# Patient Record
Sex: Female | Born: 1990
Health system: Southern US, Community
[De-identification: ages and names within clinical notes are randomized; demographics above are authoritative.]

## PROBLEM LIST (undated history)

## (undated) DIAGNOSIS — D649 Anemia, unspecified: Secondary | ICD-10-CM

## (undated) DIAGNOSIS — K921 Melena: Secondary | ICD-10-CM

## (undated) DIAGNOSIS — K219 Gastro-esophageal reflux disease without esophagitis: Secondary | ICD-10-CM

## (undated) DIAGNOSIS — D219 Benign neoplasm of connective and other soft tissue, unspecified: Secondary | ICD-10-CM

## (undated) DIAGNOSIS — N390 Urinary tract infection, site not specified: Secondary | ICD-10-CM

## (undated) DIAGNOSIS — A64 Unspecified sexually transmitted disease: Secondary | ICD-10-CM

## (undated) DIAGNOSIS — B019 Varicella without complication: Secondary | ICD-10-CM

## (undated) HISTORY — PX: OTHER SURGICAL HISTORY: SHX169

## (undated) HISTORY — PX: COLPOSCOPY: SHX161

## (undated) HISTORY — DX: Melena: K92.1

## (undated) HISTORY — DX: Benign neoplasm of connective and other soft tissue, unspecified: D21.9

## (undated) HISTORY — DX: Unspecified sexually transmitted disease: A64

## (undated) HISTORY — DX: Gastro-esophageal reflux disease without esophagitis: K21.9

## (undated) HISTORY — DX: Urinary tract infection, site not specified: N39.0

## (undated) HISTORY — DX: Anemia, unspecified: D64.9

## (undated) HISTORY — DX: Varicella without complication: B01.9

---

## 2013-08-18 ENCOUNTER — Inpatient Hospital Stay: Payer: Self-pay | Admitting: Internal Medicine

## 2013-08-18 LAB — COMPREHENSIVE METABOLIC PANEL
AST: 29 U/L (ref 15–37)
Albumin: 3.9 g/dL (ref 3.4–5.0)
Alkaline Phosphatase: 91 U/L
Anion Gap: 8 (ref 7–16)
BUN: 9 mg/dL (ref 7–18)
Bilirubin,Total: 0.7 mg/dL (ref 0.2–1.0)
CHLORIDE: 104 mmol/L (ref 98–107)
Calcium, Total: 9.4 mg/dL (ref 8.5–10.1)
Co2: 24 mmol/L (ref 21–32)
Creatinine: 0.82 mg/dL (ref 0.60–1.30)
EGFR (Non-African Amer.): >60
Glucose: 110 mg/dL — ABNORMAL HIGH (ref 65–99)
Osmolality: 271 (ref 275–301)
Potassium: 3.6 mmol/L (ref 3.5–5.1)
SGPT (ALT): 34 U/L (ref 12–78)
Sodium: 136 mmol/L (ref 136–145)
TOTAL PROTEIN: 8.1 g/dL (ref 6.4–8.2)

## 2013-08-18 LAB — CBC WITH DIFFERENTIAL/PLATELET
Basophil #: 0.1 10*3/uL (ref 0.0–0.1)
Basophil %: 0.6 %
EOS ABS: 0 10*3/uL (ref 0.0–0.7)
EOS PCT: 0 %
HCT: 38.9 % (ref 35.0–47.0)
HGB: 13 g/dL (ref 12.0–16.0)
LYMPHS PCT: 15.8 %
Lymphocyte #: 2.3 10*3/uL (ref 1.0–3.6)
MCH: 30.8 pg (ref 26.0–34.0)
MCHC: 33.5 g/dL (ref 32.0–36.0)
MCV: 92 fL (ref 80–100)
MONO ABS: 0.8 x10 3/mm (ref 0.2–0.9)
Monocyte %: 5.3 %
NEUTROS ABS: 11.2 10*3/uL — AB (ref 1.4–6.5)
NEUTROS PCT: 78.3 %
Platelet: 325 10*3/uL (ref 150–440)
RBC: 4.23 10*6/uL (ref 3.80–5.20)
RDW: 12.9 % (ref 11.5–14.5)
WBC: 14.4 10*3/uL — ABNORMAL HIGH (ref 3.6–11.0)

## 2013-08-18 LAB — URINALYSIS, COMPLETE
Bacteria: NONE SEEN
Bilirubin,UR: NEGATIVE
GLUCOSE, UR: NEGATIVE mg/dL (ref 0–75)
Nitrite: NEGATIVE
Ph: 7 (ref 4.5–8.0)
Protein: NEGATIVE
RBC,UR: 6 /HPF (ref 0–5)
SPECIFIC GRAVITY: 1.016 (ref 1.003–1.030)
Squamous Epithelial: 4
WBC UR: 5 /HPF (ref 0–5)

## 2013-08-18 LAB — LIPASE, BLOOD: Lipase: 141 U/L (ref 73–393)

## 2013-08-19 LAB — CBC WITH DIFFERENTIAL/PLATELET
BASOS ABS: 0 10*3/uL (ref 0.0–0.1)
Basophil %: 0.2 %
EOS ABS: 0 10*3/uL (ref 0.0–0.7)
Eosinophil %: 0.3 %
HCT: 34.9 % — ABNORMAL LOW (ref 35.0–47.0)
HGB: 11.9 g/dL — ABNORMAL LOW (ref 12.0–16.0)
Lymphocyte #: 2.5 10*3/uL (ref 1.0–3.6)
Lymphocyte %: 20.7 %
MCH: 31.8 pg (ref 26.0–34.0)
MCHC: 34.1 g/dL (ref 32.0–36.0)
MCV: 93 fL (ref 80–100)
Monocyte #: 0.9 x10 3/mm (ref 0.2–0.9)
Monocyte %: 7.2 %
NEUTROS ABS: 8.8 10*3/uL — AB (ref 1.4–6.5)
Neutrophil %: 71.6 %
Platelet: 266 10*3/uL (ref 150–440)
RBC: 3.75 10*6/uL — AB (ref 3.80–5.20)
RDW: 13 % (ref 11.5–14.5)
WBC: 12.3 10*3/uL — AB (ref 3.6–11.0)

## 2013-08-19 LAB — COMPREHENSIVE METABOLIC PANEL
ALK PHOS: 77 U/L
ANION GAP: 4 — AB (ref 7–16)
AST: 20 U/L (ref 15–37)
Albumin: 3.2 g/dL — ABNORMAL LOW (ref 3.4–5.0)
BILIRUBIN TOTAL: 0.8 mg/dL (ref 0.2–1.0)
BUN: 9 mg/dL (ref 7–18)
CHLORIDE: 108 mmol/L — AB (ref 98–107)
Calcium, Total: 8.6 mg/dL (ref 8.5–10.1)
Co2: 26 mmol/L (ref 21–32)
Creatinine: 0.89 mg/dL (ref 0.60–1.30)
EGFR (African American): >60
EGFR (Non-African Amer.): >60
GLUCOSE: 85 mg/dL (ref 65–99)
OSMOLALITY: 274 (ref 275–301)
Potassium: 3.6 mmol/L (ref 3.5–5.1)
SGPT (ALT): 26 U/L (ref 12–78)
SODIUM: 138 mmol/L (ref 136–145)
Total Protein: 6.8 g/dL (ref 6.4–8.2)

## 2013-08-19 LAB — MAGNESIUM: MAGNESIUM: 1.7 mg/dL — AB

## 2013-08-20 LAB — CBC WITH DIFFERENTIAL/PLATELET
BASOS ABS: 0.1 10*3/uL (ref 0.0–0.1)
Basophil %: 0.4 %
EOS PCT: 0.2 %
Eosinophil #: 0 10*3/uL (ref 0.0–0.7)
HCT: 34.6 % — AB (ref 35.0–47.0)
HGB: 11.7 g/dL — AB (ref 12.0–16.0)
Lymphocyte #: 3.1 10*3/uL (ref 1.0–3.6)
Lymphocyte %: 26 %
MCH: 31.4 pg (ref 26.0–34.0)
MCHC: 33.9 g/dL (ref 32.0–36.0)
MCV: 93 fL (ref 80–100)
MONOS PCT: 7.4 %
Monocyte #: 0.9 x10 3/mm (ref 0.2–0.9)
Neutrophil #: 7.9 10*3/uL — ABNORMAL HIGH (ref 1.4–6.5)
Neutrophil %: 66 %
Platelet: 263 10*3/uL (ref 150–440)
RBC: 3.74 10*6/uL — ABNORMAL LOW (ref 3.80–5.20)
RDW: 12.8 % (ref 11.5–14.5)
WBC: 12 10*3/uL — ABNORMAL HIGH (ref 3.6–11.0)

## 2013-08-20 LAB — COMPREHENSIVE METABOLIC PANEL
ANION GAP: 3 — AB (ref 7–16)
Albumin: 3 g/dL — ABNORMAL LOW (ref 3.4–5.0)
Alkaline Phosphatase: 79 U/L
BILIRUBIN TOTAL: 1 mg/dL (ref 0.2–1.0)
BUN: 6 mg/dL — AB (ref 7–18)
CHLORIDE: 107 mmol/L (ref 98–107)
Calcium, Total: 8.6 mg/dL (ref 8.5–10.1)
Co2: 28 mmol/L (ref 21–32)
Creatinine: 0.95 mg/dL (ref 0.60–1.30)
EGFR (African American): >60
Glucose: 92 mg/dL (ref 65–99)
Osmolality: 273 (ref 275–301)
POTASSIUM: 3.7 mmol/L (ref 3.5–5.1)
SGOT(AST): 19 U/L (ref 15–37)
SGPT (ALT): 24 U/L (ref 12–78)
SODIUM: 138 mmol/L (ref 136–145)
Total Protein: 7.1 g/dL (ref 6.4–8.2)

## 2013-08-20 LAB — LIPASE, BLOOD: Lipase: 122 U/L (ref 73–393)

## 2013-08-20 LAB — URINE CULTURE

## 2013-08-21 LAB — COMPREHENSIVE METABOLIC PANEL
ALBUMIN: 2.8 g/dL — AB (ref 3.4–5.0)
ALT: 24 U/L (ref 12–78)
Alkaline Phosphatase: 71 U/L
Anion Gap: 5 — ABNORMAL LOW (ref 7–16)
BUN: 4 mg/dL — ABNORMAL LOW (ref 7–18)
Bilirubin,Total: 0.7 mg/dL (ref 0.2–1.0)
Calcium, Total: 8.5 mg/dL (ref 8.5–10.1)
Chloride: 109 mmol/L — ABNORMAL HIGH (ref 98–107)
Co2: 27 mmol/L (ref 21–32)
Creatinine: 0.93 mg/dL (ref 0.60–1.30)
EGFR (African American): >60
EGFR (Non-African Amer.): >60
GLUCOSE: 88 mg/dL (ref 65–99)
OSMOLALITY: 278 (ref 275–301)
POTASSIUM: 3.6 mmol/L (ref 3.5–5.1)
SGOT(AST): 18 U/L (ref 15–37)
SODIUM: 141 mmol/L (ref 136–145)
Total Protein: 6.5 g/dL (ref 6.4–8.2)

## 2013-08-21 LAB — CBC WITH DIFFERENTIAL/PLATELET
Basophil #: 0.1 10*3/uL (ref 0.0–0.1)
Basophil %: 0.6 %
EOS PCT: 1.1 %
Eosinophil #: 0.1 10*3/uL (ref 0.0–0.7)
HCT: 32.8 % — AB (ref 35.0–47.0)
HGB: 11 g/dL — AB (ref 12.0–16.0)
LYMPHS ABS: 3.6 10*3/uL (ref 1.0–3.6)
Lymphocyte %: 40.7 %
MCH: 31.2 pg (ref 26.0–34.0)
MCHC: 33.5 g/dL (ref 32.0–36.0)
MCV: 93 fL (ref 80–100)
Monocyte #: 0.6 x10 3/mm (ref 0.2–0.9)
Monocyte %: 6.8 %
NEUTROS PCT: 50.8 %
Neutrophil #: 4.5 10*3/uL (ref 1.4–6.5)
Platelet: 253 10*3/uL (ref 150–440)
RBC: 3.52 10*6/uL — ABNORMAL LOW (ref 3.80–5.20)
RDW: 12.9 % (ref 11.5–14.5)
WBC: 8.9 10*3/uL (ref 3.6–11.0)

## 2013-08-22 LAB — COMPREHENSIVE METABOLIC PANEL
ALK PHOS: 81 U/L
Albumin: 3 g/dL — ABNORMAL LOW (ref 3.4–5.0)
Anion Gap: 10 (ref 7–16)
BILIRUBIN TOTAL: 1.2 mg/dL — AB (ref 0.2–1.0)
BUN: 5 mg/dL — AB (ref 7–18)
Calcium, Total: 9 mg/dL (ref 8.5–10.1)
Chloride: 100 mmol/L (ref 98–107)
Co2: 27 mmol/L (ref 21–32)
Creatinine: 0.93 mg/dL (ref 0.60–1.30)
EGFR (Non-African Amer.): >60
Glucose: 112 mg/dL — ABNORMAL HIGH (ref 65–99)
Osmolality: 272 (ref 275–301)
POTASSIUM: 3.4 mmol/L — AB (ref 3.5–5.1)
SGOT(AST): 41 U/L — ABNORMAL HIGH (ref 15–37)
SGPT (ALT): 42 U/L (ref 12–78)
SODIUM: 137 mmol/L (ref 136–145)
TOTAL PROTEIN: 7.3 g/dL (ref 6.4–8.2)

## 2013-08-22 LAB — CBC WITH DIFFERENTIAL/PLATELET
Basophil #: 0 10*3/uL
Basophil %: 0.1 %
Eosinophil #: 0 10*3/uL
Eosinophil %: 0 %
HCT: 35.8 %
HGB: 12.5 g/dL
Lymphocyte %: 8.3 %
Lymphs Abs: 1.3 10*3/uL
MCH: 31.8 pg
MCHC: 34.8 g/dL
MCV: 91 fL
Monocyte #: 0.9 10*3/uL
Monocyte %: 6.1 %
Neutrophil #: 13.2 10*3/uL — ABNORMAL HIGH
Neutrophil %: 85.5 %
Platelet: 302 10*3/uL
RBC: 3.92 X10 6/mm 3
RDW: 12.8 %
WBC: 15.4 10*3/uL — ABNORMAL HIGH

## 2013-08-22 LAB — PATHOLOGY REPORT

## 2014-08-22 NOTE — Op Note (Signed)
PATIENT NAME:  Linda Ochoa, Linda Ochoa MR#:  [id number] DATE OF BIRTH:  [birth year]  DATE OF PROCEDURE:  08/21/2013  PREOPERATIVE DIAGNOSIS: Choledocholithiasis.  POSTOPERATIVE DIAGNOSIS:  Choledocholithiasis with cholecystitis.  PROCEDURE:  Laparoscopic cholecystectomy with cholangiography.   SURGEON: Phoebe Perch, MD.   ANESTHESIA: General with endotracheal tube.   INDICATIONS FOR PROCEDURE: This is a patient with a history of choledocholithiasis status post ERCP and stone extraction by Dr. Allen Norris. Preoperatively, we discussed rationale for offering surgery and the risks of bleeding, infection, recurrence of symptoms, failure to resolve all her symptoms, open procedure, bile duct damage, bile duct leak, retained common bile duct stone, any of which could require further surgery and/or ERCP, stent and papillotomy. This was all reviewed for her and her family. They understood and agreed to proceed.   FINDINGS: Acute cholecystitis with a multitude of stones, a long tortuous cystic duct. C-arm fluoroscopic cholangiography confirmed that the cystic duct had been entered. Proximal and distal bile ducts were identified. There was good flow in the duodenum without intraluminal filling defects.  Also of note, there was unusual anatomy due to greatly distended and enlarged gallbladder with the infundibulum flopping over the typical cystic duct area resulting in unusual anatomy, but confirmation of proper anatomy was performed by cholangiography prior to clipping or dividing ducts.   DESCRIPTION OF PROCEDURE: The patient was induced to general anesthesia. She was on IV antibiotics and VTE prophylaxis was in place. She was prepped and draped in a sterile fashion. Marcaine was infiltrated in skin and subcutaneous tissues around the periumbilical area. Incision was made. Veress needle was placed. Pneumoperitoneum was obtained. A 5 mm trocar port was placed. The abdominal cavity was explored, and under direct vision a  10 mm epigastric port and 2 lateral 5 mm ports were placed. The gallbladder was placed on tension. It was found to be intrahepatic and greatly distended. The infundibulum extended well down towards the pancreas. Elevation of the infundibulum after placing the gallbladder on tension allowed for visualization of what appeared to be a lateral filling structure that collapsed with manual pressure and refilled promptly, much like a venous structure. It was quite large and was lateral to an area of vasculature. Dissection out of the small vessels, including veins and arteries, was performed by doubly clipping and dividing these. Further meticulous dissection was performed in order to ultimately identify that the cystic duct was, in fact, this lateral structure. It was elevated and clipped and incised and through a separate incision, an Angiocath and cholangiogram catheter was placed. C-arm fluoroscopic cholangiography demonstrated the above. Therefore, the cystic duct was triply clipped and divided and then further dissection was performed to allow elevation of the gallbladder. The gallbladder was greatly inflamed and edematous, and because of its intrahepatic component, the gallbladder tore and spilled a multitude of stones. Once the gallbladder was removed, it was realized that a portion of the back wall of the gallbladder had been left in situ. The gallbladder was placed into a bag with a large number of stones and retrieved through the enlarged epigastric port site. A second bag was placed to retrieve the stones, and all stones that were visible were removed and sent off for examination with the gallbladder. The area was irrigated with copious amounts of normal saline. There was no sign of bile leak, bleeding or additional stones or bowel injury. Therefore, into the lateral port site was placed a 10 mm JP drain placed into the foramen of Winslow and held in with  3-0 nylon. The area was checked for hemostasis and found  to be adequate. The camera was placed in the epigastric site to view back the periumbilical site. There was no sign of stone spillage or bowel injury. Therefore, pneumoperitoneum was released. All ports were removed. Fascial edges were approximated with figure-of-eight 0 Vicryls and then staples were placed and sterile dressing was placed. The drain was placed to bulb suction.  The patient tolerated this procedure well. There were no complications. She was taken to the recovery room in stable condition to be admitted for continued care.   ____________________________ Jerrol Banana. Burt Knack, MD rec:ce D: 08/21/2013 13:42:22 ET T: 08/21/2013 14:11:34 ET JOB#: [id number]  cc: Jerrol Banana. Burt Knack, MD, <Dictator> Florene Glen MD ELECTRONICALLY SIGNED 08/21/2013 18:22

## 2014-08-22 NOTE — Discharge Summary (Signed)
PATIENT NAME:  Linda Ochoa, Linda Ochoa MR#:  [id number] DATE OF BIRTH:  12-22-1990  DATE OF ADMISSION:  08/18/2013 DATE OF DISCHARGE:  08/23/2013  ADMISSION DIAGNOSIS: Abdominal pain.   DISCHARGE DIAGNOSIS: Choledocholithiasis with cholecystitis.   CONSULTATIONS:  1.  Dr. Allen Norris.  2.  Dr. Burt Knack.   PROCEDURES:  1.  The patient underwent ERCP, 08/19/2013, which showed choledocholithiasis.  2.  On 08/21/2013, the patient underwent a laparoscopic cholecystectomy.   LABORORIES AT DISCHARGE: White blood cells 15, hemoglobin 12.5, hematocrit 36, platelets 382. Sodium 137, potassium 3.4, chloride 100, bicarb 27, BUN 5, creatinine 0.93,  glucose is 112, AST is 42, ALT 41, total protein 7.3, albumin 3.0.   HOSPITAL COURSE: This is a 24 year old female with no past medical history, presented with abdominal pain, nausea and vomiting, was found to have choledocholithiasis with acute cholecystitis. For further details, please refer to the H and P.   1.  Abdominal pain. The patient presented with abdominal pain. She was found to have choledocholithiasis with acute cholecystitis. She was placed on broad-spectrum antibiotics. GI and surgery were consulted. She underwent an ERCP, as stated above, on the 21st of April, and then underwent a laparoscopic cholecystectomy with cholangiogram on the 23rd of April. She has some mild abdominal pain from her surgery, but is tolerating her diet fairly well. She will follow-up Dr. Burt Knack in two weeks.  2.  Choledocholithiasis due to cholecystitis;  resolving   3.  Hypomagnesemia, which was repleted.   DISCHARGE MEDICATIONS:  1.  Acetaminophen/oxycodone 325/7.5 q.6 hour's p.r.n. pain, #20.  2.  Augmentin 875, 1 tablet b.i.d. x 10 days.  3.  Zofran 4 mg q.8 hour's p.r.n., #20.   DISCHARGE DRESSING:  Remove dressing tomorrow, may shower.   DISCHARGE DIET: Regular diet.   DISCHARGE ACTIVITY: As tolerated.   DISCHARGE FOLLOWUP:  The patient will followup with Dr. Burt Knack in  two weeks.   TIME SPENT: Approximately 35 minutes.   ____________________________ Donell Beers. Benjie Karvonen, MD spm:aj D: 08/23/2013 12:50:09 ET T: 08/24/2013 00:22:37 ET JOB#: [id number]  cc: Ednah Hammock P. Benjie Karvonen, MD, <Dictator> Richard E. Burt Knack, MD Ulice Bold P Kambrie Eddleman MD ELECTRONICALLY SIGNED 08/24/2013 13:30

## 2014-08-22 NOTE — Consult Note (Signed)
Chief Complaint:  Subjective/Chief Complaint Pt doing well.  Denies any abbdominal pain, nausea or vomiting.  No BM 4 days.   VITAL SIGNS/ANCILLARY NOTES: **Vital Signs.:   22-Apr-15 14:53  Vital Signs Type Routine  Temperature Temperature (F) 98.2  Celsius 36.7  Temperature Source oral  Pulse Pulse 83  Respirations Respirations 18  Systolic BP Systolic BP 098  Diastolic BP (mmHg) Diastolic BP (mmHg) 73  Mean BP 85  Pulse Ox % Pulse Ox % 97  Pulse Ox Activity Level  At rest  Oxygen Delivery Room Air/ 21 %   Brief Assessment:  GEN no acute distress, obese, A/oX3   Cardiac Regular   Respiratory normal resp effort   Gastrointestinal details normal Soft  Nondistended  No masses palpable  Bowel sounds normal  Exam limited given habitus   EXTR negative cyanosis/clubbing   Additional Physical Exam HEENT: sclera clear, anicteric Skin: no jaundice, warm, dry   Lab Results: Hepatic:  22-Apr-15 05:56   Bilirubin, Total 1.0  Alkaline Phosphatase 79 (45-117 NOTE: New Reference Range 03/21/13)  SGPT (ALT) 24  SGOT (AST) 19  Total Protein, Serum 7.1  Albumin, Serum  3.0  Routine Chem:  22-Apr-15 05:56   Glucose, Serum 92  BUN  6  Creatinine (comp) 0.95  Sodium, Serum 138  Potassium, Serum 3.7  Chloride, Serum 107  CO2, Serum 28  Calcium (Total), Serum 8.6  Osmolality (calc) 273  eGFR (African American) >60  eGFR (Non-African American) >60 (eGFR values <104m/min/1.73 m2 may be an indication of chronic kidney disease (CKD). Calculated eGFR is useful in patients with stable renal function. The eGFR calculation will not be reliable in acutely ill patients when serum creatinine is changing rapidly. It is not useful in  patients on dialysis. The eGFR calculation may not be applicable to patients at the low and high extremes of body sizes, pregnant women, and vegetarians.)  Anion Gap  3  Lipase 122 (Result(s) reported on 20 Aug 2013 at 06:39AM.)  Routine Hem:  22-Apr-15  05:56   WBC (CBC)  12.0  RBC (CBC)  3.74  Hemoglobin (CBC)  11.7  Hematocrit (CBC)  34.6  Platelet Count (CBC) 263  MCV 93  MCH 31.4  MCHC 33.9  RDW 12.8  Neutrophil % 66.0  Lymphocyte % 26.0  Monocyte % 7.4  Eosinophil % 0.2  Basophil % 0.4  Neutrophil #  7.9  Lymphocyte # 3.1  Monocyte # 0.9  Eosinophil # 0.0  Basophil # 0.1 (Result(s) reported on 20 Aug 2013 at 06:37AM.)   Assessment/Plan:  Assessment/Plan:  Assessment Biliary pancreatitis s/p ERCP with sludge removal/sphincterotomy:  Doing well.   Plan 1) continue supportive measures 2) cholecystectomy per surgery tomorrow 3) Colace 1074mPO BID Will sign off, Please call if you have any questions or concerns   Electronic Signatures: JoAndria MeuseNP)  (Signed 22-Apr-15 18:33)  Authored: Chief Complaint, VITAL SIGNS/ANCILLARY NOTES, Brief Assessment, Lab Results, Assessment/Plan   Last Updated: 22-Apr-15 18:33 by JoAndria MeuseNP)

## 2014-08-22 NOTE — Consult Note (Signed)
PATIENT NAME:  Linda Ochoa, Linda Ochoa MR#:  [id number] DATE OF BIRTH:  10-04-1990  DATE OF CONSULTATION:  08/18/2013  REFERRING PHYSICIAN:   CONSULTING PHYSICIAN:  Jerrol Banana. Burt Knack, MD  CHIEF COMPLAINT: Abdominal pain.   HISTORY OF PRESENT ILLNESS: This is a 24 year old morbidly obese female patient who presents with severe right upper quadrant pain, some radiation through to her back. This has been going on since the weekend. She was seen in the Emergency Room and admitted through the ED to ([facility]) <<[facility]>>  who asked me to see the patient for likely choledocholithiasis. She has had vomiting and has been unable to eat.   PAST MEDICAL HISTORY: None.   PAST SURGICAL HISTORY: None.   ALLERGIES: None.   MEDICATIONS: None.   FAMILY HISTORY: Noncontributory.   SOCIAL HISTORY: The patient is not working currently. She only rarely drinks alcohol and does not smoke tobacco products.  REVIEW OF SYSTEMS: A 10-system review is performed and negative with the exception of that mentioned in the HPI.   PHYSICAL EXAMINATION:  GENERAL: Morbidly obese female patient appearing somewhat uncomfortable. BMI is 49, 329 pounds,  69 inches tall.  VITAL SIGNS: Stable. She is afebrile.  HEENT: Shows no scleral icterus.  NECK: No palpable neck nodes.  CHEST: Clear to auscultation.   CARDIAC: Regular rate and rhythm.  ABDOMEN: Soft. There is tenderness in the epigastrium of her right upper quadrant.  EXTREMITIES: Without edema.  NEUROLOGIC: Grossly intact.  INTEGUMENT: Shows no jaundice.   LABORATORY DATA: White blood cell count is 14.4, H and H of 13 and 39. Liver function tests are normal as is lipase. Choledocholithiasis is identified on an MRI.   ASSESSMENT AND PLAN: This is a patient with choledocholithiasis. Dr. Allen Norris has been consulted to perform an endoscopic cholangiopancreatography with possible stone extraction. I will be following the patient and planned cholecystectomy once her labs  improve or Dr. Allen Norris is able to remove a stone.   ____________________________ Jerrol Banana Burt Knack, MD rec:dd D: 08/19/2013 18:27:04 ET T: 08/19/2013 18:44:29 ET JOB#: [id number]  cc: Jerrol Banana. Burt Knack, MD, <Dictator> Florene Glen MD ELECTRONICALLY SIGNED 08/20/2013 7:52

## 2014-08-22 NOTE — H&P (Signed)
PATIENT NAME:  Linda Ochoa, Linda Ochoa MR#:  [id number] DATE OF BIRTH:  09-02-1990  DATE OF ADMISSION:  08/18/2013  REFERRING PHYSICIAN: Dr. Lurline Hare  CHIEF COMPLAINT: Abdominal pain.   PRIMARY CARE PHYSICIAN: None.  HISTORY OF PRESENT ILLNESS: This is a very nice 24 year old female, obese, no other medical problems, who comes with a history of abdominal pain that started on Friday, 4 days ago. That started overnight, located in the right upper quadrant without any significant radiation. When it started was 8 out of 10 and remained waxing and waning down during the whole weekend. It was coming down to where it was tolerable, but last night the pain was not tolerable anymore for what the patient decided to come to the Emergency Department. The patient has been vomiting significant amounts, probably 15 or 20 times, since the problem started. The patient states that she has been only drinking fluids for what all the vomiting is pretty much gastric content and clear fluids. The patient has not had any fever, but she was having chills yesterday.   The patient states that she had a previous episode like this 2 weeks ago and it has happened prior to that as well, but it has not lasted this long for what she has not sought any medical attention for it. The patient was evaluated in the Emergency Department and ultrasound of the abdomen was done showing no cholelithiasis, but multiple stones and possible obstruction of the common bile duct. The patient states all of these episodes of nausea, vomiting, and abdominal pain have been brought up after 30 minutes or 40 minutes of a meal. The patient admitted for further management.  REVIEW OF SYSTEMS: A 12 system review is done.  CONSTITUTIONAL: No fever, fatigue, weakness, weight loss or weight gain.  EYES: No blurry vision, double vision.  ENT: No difficulty swallowing or tinnitus.  RESPIRATORY: No cough, wheezing, hemoptysis or COPD. CARDIOVASCULAR: No chest pain,  orthopnea, or syncope.  GASTROINTESTINAL: Positive nausea. Positive vomiting. Positive abdominal pain. No diarrhea. No constipation. No hematochezia. The patient denies any jaundice, acholia or changes of the color of the urine or the stool. GENITOURINARY: No dysuria, hematuria.  GYNECOLOGIC: No breast masses or vaginal discharge.  ENDOCRINE: No polyuria, polydipsia, polyphagia, cold or heat intolerance.  HEMATOLOGIC AND LYMPHATIC: No anemia, easy bruising or bleeding.  MUSCULOSKELETAL: No significant neck pain, back pain, or gout.  NEUROLOGIC: No numbness, tingling, or TIA.  PSYCHIATRIC: No insomnia or depression.   PAST MEDICAL HISTORY: Obesity.   ALLERGIES: No known drug allergies.  PAST SURGICAL HISTORY: None.   CURRENT MEDICATIONS: None.   FAMILY HISTORY: Positive for coronary artery disease in father and grandfather. His father had a heart attack at the age of 7, and he recently had another one within the last year. Hypertension runs in multiple members of her family. Diabetes does as well. No history of cancer at this moment.   SOCIAL HISTORY: The patient denies any smoking. The patient drinks very rarely. 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 Denies any drug abuse.   PHYSICAL EXAMINATION: VITAL SIGNS: Blood pressure 114/57, pulse 83, respirations 18, temperature 97.8, oxygen saturation 98% on room air.  GENERAL: The patient is alert and oriented x3. No acute distress. No respiratory distress. Hemodynamically stable.  HEENT: Pupils are equal and reactive. Extraocular movements intact. Mucosa is moist. Anicteric sclerae. Pink conjunctivae. No oral lesions. No oropharyngeal exudates.  NECK: Supple. No JVD. No thyromegaly. No adenopathy. No  carotid bruits.  CARDIOVASCULAR: Regular rate and rhythm. No murmurs, rubs or gallops are appreciated. No displacement of PMI.  LUNGS: Clear without any wheezing or crepitus. No use of accessory muscles.  ABDOMEN: Soft.  At this moment it is not significantly tender unless palpation at the level of the right upper quadrant. Whenever we do that she feels tenderness, but her pain is better controlled. Percell Miller sign is equivocal. No rebound tenderness. No guarding. Bowel sounds are positive. No hepatosplenomegaly.  GENITAL: Deferred.  EXTREMITIES: No edema, cyanosis or clubbing.  VASCULAR: Pulses +2. Capillary refill less than 3.  LYMPHATIC: Negative for lymphadenopathy in neck or supraclavicular areas.  MUSCULOSKELETAL: No significant joint effusions or joint swelling.  NEUROLOGIC: Cranial nerves II through XII grossly with no focal findings. Strength is equal in all 4 extremities.  PSYCHIATRIC: No agitation. The patient is alert and oriented x3.  DIAGNOSTIC DATA: Ultrasound of the abdomen, As mentioned above, has dilated proximal common bile duct to 1 cm. Distal common bile duct not visualized. Possible other consideration of choledocholithiasis. The patient has cholelithiasis without sonographic evidence of acute cholecystitis.   Glucose 110, creatinine 0.82, potassium 3.6. Other electrolytes were within normal limits. Her LFTs are normal without any significant elevation of alkaline phosphatase or bilirubin which makes very unlikely the possibility of choledocholithiasis, but it still has to evaluated. White blood count is 14.4, hemoglobin 13, and platelet count 325,000.  ASSESSMENT AND PLAN: A 24 year old female overall healthy, obese, who has cholecystitis. The patient is being admitted for abdominal pain.  1.  Abdominal pain. The patient has right upper quadrant abdominal pain, very tender to palpation earlier. Right now has improved. The patient has significant lithiasis of the gallbladder for what for what an ultrasound was done showing no evidence of cholecystitis, but dilation of the common bile duct, especially proximally. The patient has significant symptoms and what appeared to be biliary colic for what we are  going to admit her for MRCP. Her alkaline phosphatase is normal. Her LFTs in general are normal with normal bilirubin. There is no fever. There is elevation of white blood count. If there is a slight fever, will have to cover her with ampicillin and metronidazole for possible choledocholithiasis, which could be infected, but at this moment there are no signs of that. We are just going to admit her for pain control, keep her n.p.o., okay for ice chips and sips of water. After the MRCP will decide if the patient requires a gastroenterology consultation for possible ERCP.  2.  Increased white blood cells. At this moment, we are going to get urinalysis and send this for a urine culture, but the patient does not look to be in any significant urinary distress. No antibiotics indicated at this moment. With the possibility of this being due to an irritation of the common bile duct we will continue to monitor.   The patient is a FULL code.   TIME SPENT: About 45 minutes with this admission.   ____________________________ Linden Sink, MD rsg:sb D: 08/18/2013 08:48:58 ET T: 08/18/2013 09:19:04 ET JOB#: [id number]  cc: Davenport Sink, MD, <Dictator> Maryjo Ragon America Brown MD ELECTRONICALLY SIGNED 09/02/2013 23:15

## 2014-08-22 NOTE — Consult Note (Signed)
PATIENT NAME:  Linda Ochoa, Linda Ochoa MR#:  [id number] DATE OF BIRTH:  [birth year]  DATE OF CONSULTATION:  08/18/2013  CONSULTING PHYSICIAN:  Andria Meuse, NP  GASTROENTEROLOGY CONSULTATION  REQUESTING PHYSICIAN: Dr. James Ivanoff.    CONSULTING GASTROENTEROLOGIST: Dr. Lucilla Lame.  PRIMARY CARE PHYSICIAN: Not applicable.  REASON FOR CONSULTATION: Choledocholithiasis.  HISTORY OF PRESENT ILLNESS: Linda Ochoa is a pleasant morbidly obese 24 year old female who was in her usual state of health until 3 days ago when she was watching TV and she began to eat dinner. She began to have severe epigastric pain that caused nausea and vomiting too numerous to count episodes. The pain is currently a 2/10, but been 7.5 out of 10. She had a similar episode approximately 2 weeks ago. She has had some rare indigestion, for which she takes Prilosec as needed prior to the onset of her symptoms. Her white blood cell count is elevated at 14.4. Her lipase is normal. An ultrasound showed multiple gallstones and a 9.9-mm common bile duct. She had an MRCP which showed acute cholecystitis and a 7-mm distal common bile duct stone. Her LFTs are normal.   PAST MEDICAL AND SURGICAL HISTORY: Obesity.   MEDICATIONS PRIOR TO ADMISSION: Denies any.   ALLERGIES: No known drug allergies.   FAMILY HISTORY: Positive for a mom with gallbladder disease, no known family history of colorectal carcinoma or other chronic GI problems or liver problems.   SOCIAL HISTORY: She is single without children. 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 She lives with her sister. She denies any tobacco. She rarely consumes alcohol and denies any illicit drug use.   REVIEW OF SYSTEMS: See HPI, otherwise negative 12-point review of systems.   PHYSICAL EXAMINATION:  VITAL SIGNS: Temperature 98.1, pulse 65, respirations 18, blood pressure 130/81. Weight 329 pounds.  GENERAL: She is a morbidly obese black female  who is alert, oriented, pleasant, cooperative, in no acute distress. She is accompanied by her family.  HEENT: Sclerae clear, non icteric. Conjunctivae pink. Oropharynx pink and moist without any lesions.  NECK: Supple without mass or thyromegaly.  CHEST: Heart regular rate and rhythm. Normal S1, S2, without murmurs, clicks, rubs or gallops.  LUNGS: Clear to auscultation bilateral.  ABDOMEN: Protuberant. Positive bowel sounds x 4. No bruits auscultated. Abdomen is soft, moderately tender in the epigastric region. There is no rebound, tenderness, or guarding. No hepatosplenomegaly or mass.  EXTREMITIES: Without clubbing or edema.  SKIN: Warm and dry without any rash or jaundice.  NEUROLOGIC: Grossly intact.  MUSCULOSKELETAL: Good equal movement and strength bilaterally.  PSYCHIATRIC: Alert, cooperative, normal mood and affect.   LABORATORY STUDIES: Urine pregnancy test was negative. Serum glucose 110 otherwise normal BMP, Lipase 141. LFTs are normal. White blood count 14.4, hemoglobin and hematocrit and platelets all normal. Urinalysis shows 1+ ketones, 2+ blood, trace LE, 6 RBCs, 5 WBCs, and 4 epithelial cells.   IMPRESSION: Linda Ochoa is a very pleasant 24 year old black female with acute cholecystitis and MRCP suggestive of dilated common bile duct with a 7-mm distal choledocholithiasis despite normal LFTs in her improvement clinically.  ERCP with Dr. Allen Norris tomorrow with sphincterotomy and stone extraction. The patient is ultimately going to need cholecystectomy. I have discussed risks and benefits of the procedure, which include but are not limited to pancreatitis, bleeding, infection, perforation, drug reaction. She agrees with this plan and consent will be obtained.   PLAN:  1. N.p.o. after midnight. Clear liquids today.  2. ERCP tomorrow  with Dr. Allen Norris.  3. Supportive measures.  4. Agree with antibiotics.   We would like to thank you for allowing [place] to participate in the care of Ms. Gravett.     ____________________________ Andria Meuse, NP klj:lt D: 08/18/2013 21:02:54 ET T: 08/18/2013 22:00:49 ET JOB#: [id number]  cc: Andria Meuse, NP, <Dictator> Andria Meuse FNP ELECTRONICALLY SIGNED 08/19/2013 14:24

## 2014-08-22 NOTE — Consult Note (Signed)
Brief Consult Note: Diagnosis: acute cholecystitis, choledocholithiasis.   Patient was seen by consultant.   Comments: Ms. Linda Ochoa is a very pleasant 24 y/o black female with acute cholecystitis & MRCP suggestive of dilated CBD with 11mm distal choledocholithiasis despite normal LFTS & her improvement clinically.  ERCP with sphincterotomy & stone extraction with Dr Allen Norris tomorrow.  Pt is ultimately in need of cholecystectomy.  Discussed risks/benefits of procedure which include but are not limited to pancreatitis, bleeding, infection, perforation & drug reaction.  Patient agrees with this plan & consent will be obtained.  Plan: 1) NPO after midnight, clear liquids today 2) ERCP tomorrow with Dr Allen Norris 3) supportive measures 4) agree with antibiotics Thanks for allowing [place] to participate in his care.  Please see full dictated note. #[id number].  Electronic Signatures: Andria Meuse (NP)  (Signed 20-Apr-15 21:03)  Authored: Brief Consult Note   Last Updated: 20-Apr-15 21:03 by Andria Meuse (NP)

## 2014-08-22 NOTE — Consult Note (Signed)
Brief Consult Note: Diagnosis: acute cholecystitis.   Patient was seen by consultant.   Recommend further assessment or treatment.   Discussed with Attending MD.   Comments: Dr Laurin Coder asked me to see pt for cholediocholithiasis but w/u suggests ac chole only with nml LFT, sltly dilated CBD. Will review MRCP and consider surgery.  Electronic Signatures: Florene Glen (MD)  (Signed 20-Apr-15 17:27)  Authored: Brief Consult Note   Last Updated: 20-Apr-15 17:27 by Florene Glen (MD)

## 2015-02-21 IMAGING — CR DG CHOLANGIOGRAM OPERATIVE
1 series · 4 of 4 positions shown · non-contrast
Comparison: MR ABDOMEN WO/W CM MRCP dated 08/18/2013

CLINICAL DATA: Cholelithiasis.

EXAM:
INTRAOPERATIVE CHOLANGIOGRAM
TECHNIQUE: Cholangiographic images from the C-arm fluoroscopic device were
submitted for interpretation post-operatively. Please see the
procedural report for the amount of contrast and the fluoroscopy
time utilized.

[Series 6001: (person_name) · 4 of 4 slices shown]
[im 1/4]
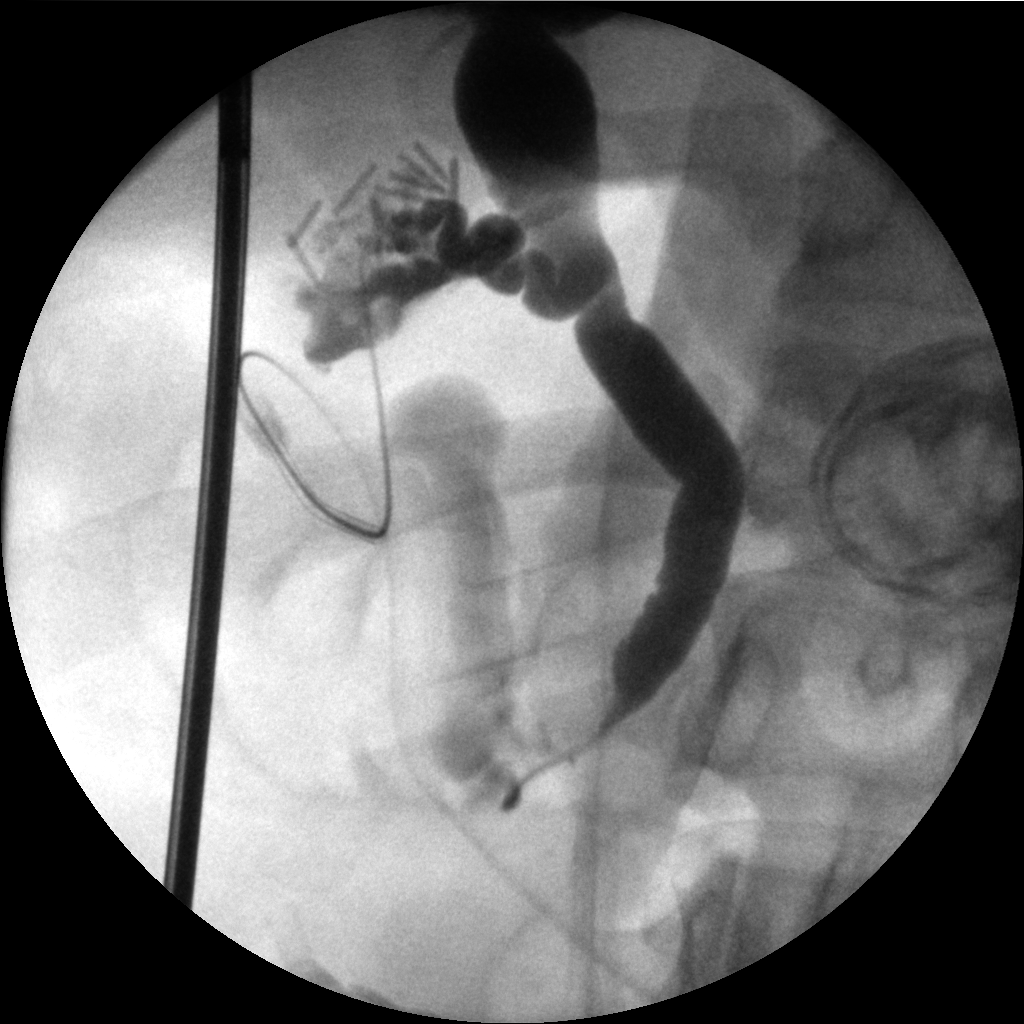
[im 2/4]
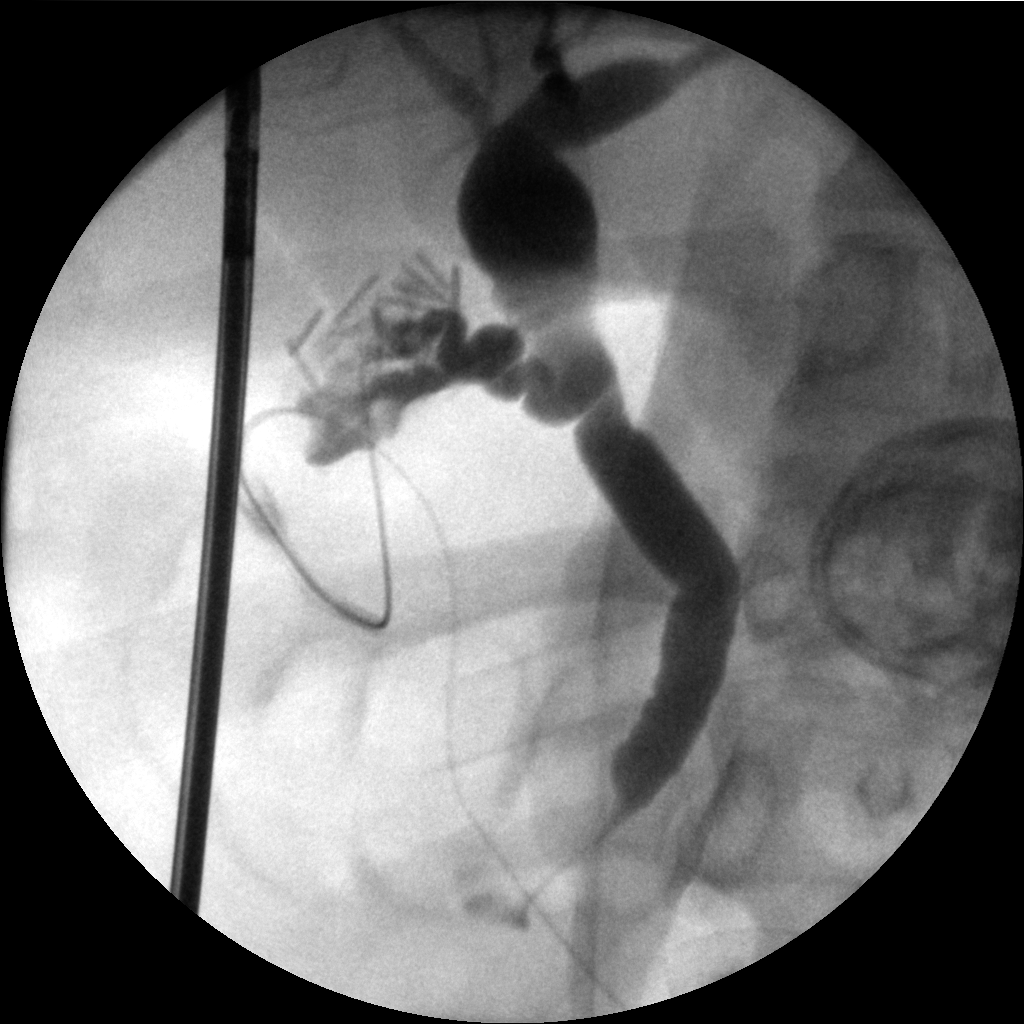
[im 3/4]
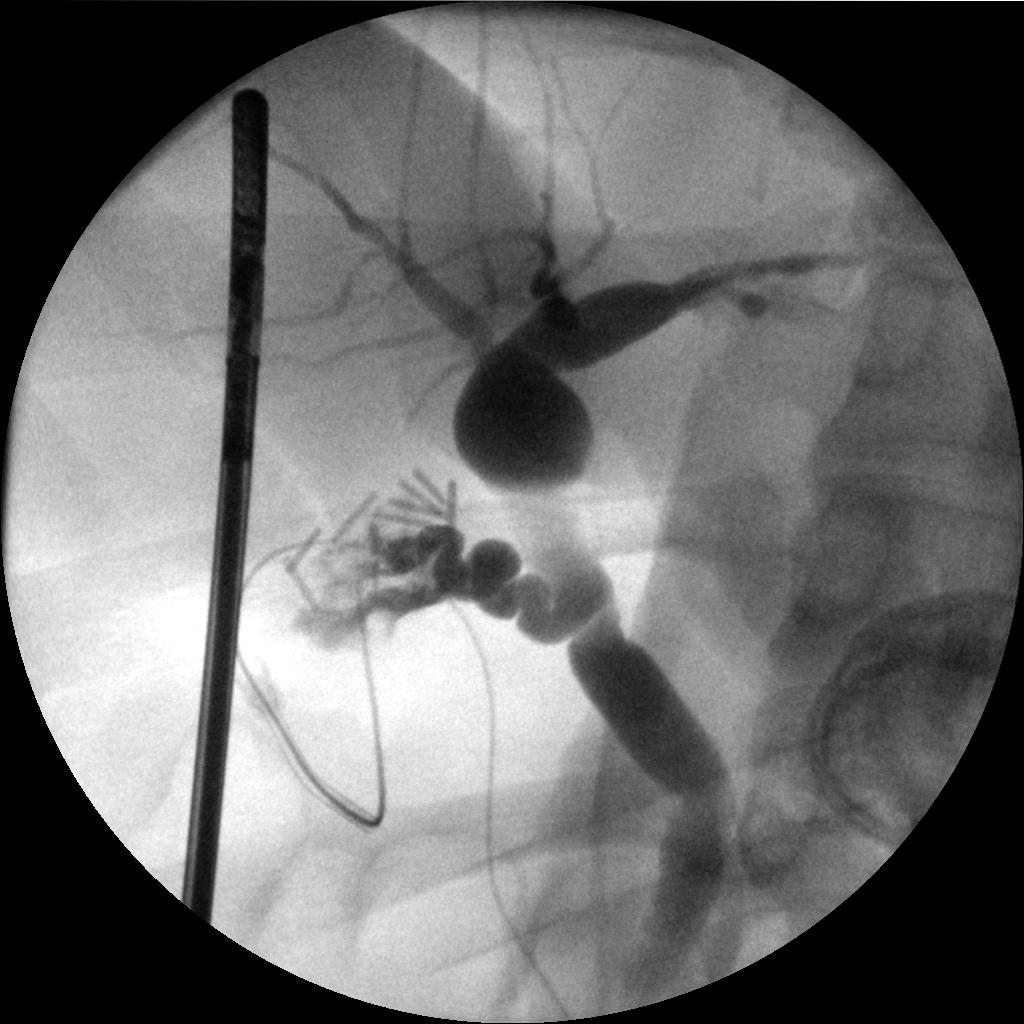
[im 4/4]
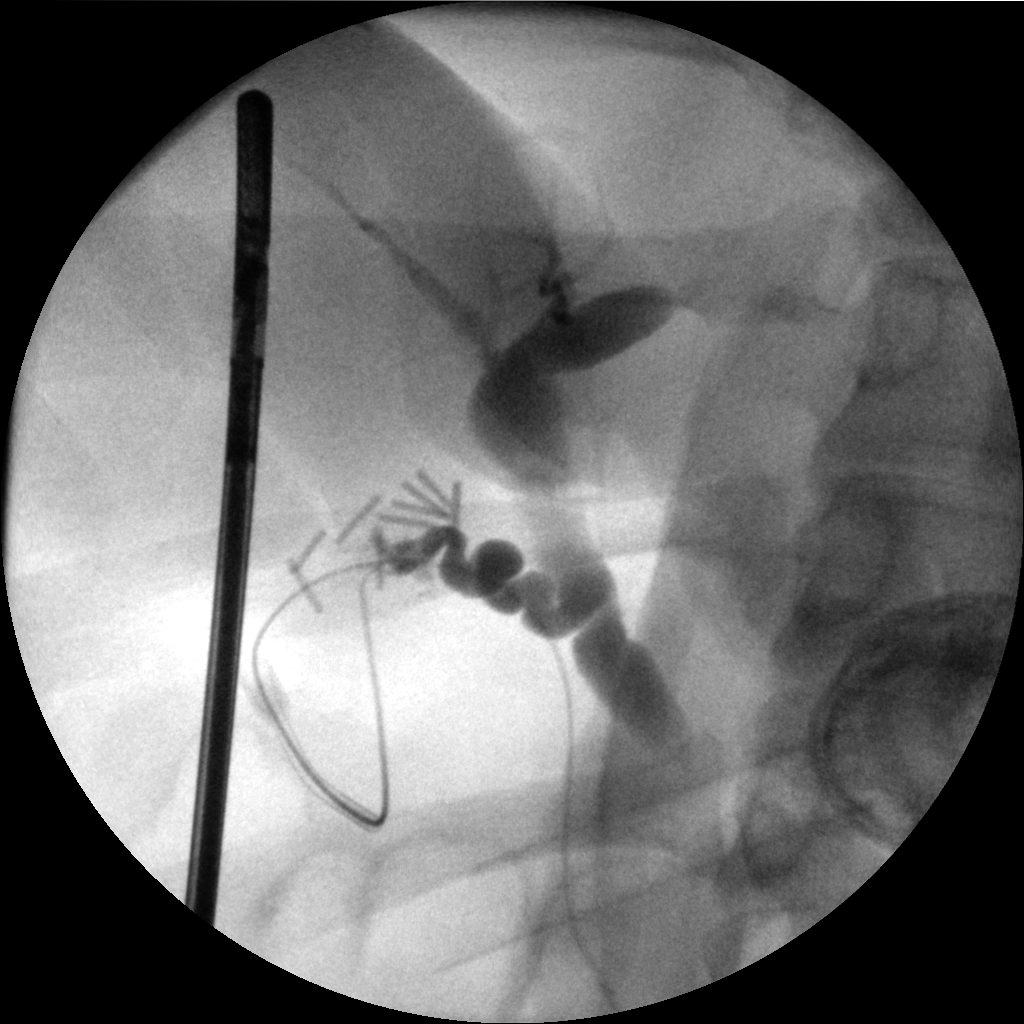

[4 of 4 positions shown; findings below may reference images not displayed]

FINDINGS: Prior cholecystectomy. Intraoperative cholangiogram reveals mild
distention of the common hepatic and common bile duct. Contrast
empties into the duodenum however the distal common bile duct is
tapered/narrowed this could be related to spasm. Stricture cannot be
completely excluded. . No evidence of filling defect to suggest
residual gallstone. Follow-up MRCP can be obtained to confirm that
the distal common bile duct narrowing is secondary to spasm .
IMPRESSION: 1. No evidence of filling defect in the distal common bile duct to
suggest stone.
2. Tapered narrowing of the distal common bile duct with mild
distention of the common hepatic and common bile ducts. This may be
related to spasm. A follow-up MRCP can be obtained to confirm that
the distal common bile duct narrowing is secondary to spasm as
opposed to a fixed stricture.

## 2015-09-30 ENCOUNTER — Emergency Department (HOSPITAL_COMMUNITY)
Admission: EM | Admit: 2015-09-30 | Discharge: 2015-10-01 | Disposition: A | Discharge status: Home / Self Care | Payer: No Typology Code available for payment source | Attending: Emergency Medicine | Admitting: Emergency Medicine

## 2015-09-30 ENCOUNTER — Encounter (HOSPITAL_COMMUNITY): Payer: Self-pay | Admitting: Emergency Medicine

## 2015-09-30 DIAGNOSIS — Y929 Unspecified place or not applicable: Secondary | ICD-10-CM | POA: Diagnosis not present

## 2015-09-30 DIAGNOSIS — Y999 Unspecified external cause status: Secondary | ICD-10-CM | POA: Insufficient documentation

## 2015-09-30 DIAGNOSIS — Z23 Encounter for immunization: Secondary | ICD-10-CM | POA: Insufficient documentation

## 2015-09-30 DIAGNOSIS — S0181XA Laceration without foreign body of other part of head, initial encounter: Secondary | ICD-10-CM | POA: Insufficient documentation

## 2015-09-30 DIAGNOSIS — H1133 Conjunctival hemorrhage, bilateral: Secondary | ICD-10-CM | POA: Insufficient documentation

## 2015-09-30 DIAGNOSIS — Y939 Activity, unspecified: Secondary | ICD-10-CM | POA: Insufficient documentation

## 2015-09-30 DIAGNOSIS — S0511XA Contusion of eyeball and orbital tissues, right eye, initial encounter: Secondary | ICD-10-CM

## 2015-09-30 DIAGNOSIS — S0990XA Unspecified injury of head, initial encounter: Secondary | ICD-10-CM | POA: Diagnosis present
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
## 2015-10-01 ENCOUNTER — Emergency Department (HOSPITAL_COMMUNITY): Payer: No Typology Code available for payment source

## 2015-10-01 MED ORDER — TETRACAINE HCL 0.5 % OP SOLN
1.0000 [drp] | Freq: Once | OPHTHALMIC | Status: AC
  Administered 2015-10-01: 1 [drp] via OPHTHALMIC
  Filled 2015-10-01: qty 4

## 2015-10-01 MED ORDER — FLUORESCEIN SODIUM 1 MG OP STRP
2.0000 | ORAL_STRIP | Freq: Once | OPHTHALMIC | Status: AC
  Administered 2015-10-01: 2 via OPHTHALMIC
  Filled 2015-10-01: qty 2

## 2015-10-01 MED ORDER — LIDOCAINE-EPINEPHRINE (PF) 2 %-1:200000 IJ SOLN
20.0000 mL | Freq: Once | INTRAMUSCULAR | Status: AC
  Administered 2015-10-01: 20 mL
  Filled 2015-10-01: qty 20

## 2015-10-01 MED ORDER — TETANUS-DIPHTH-ACELL PERTUSSIS 5-2.5-18.5 LF-MCG/0.5 IM SUSP
0.5000 mL | Freq: Once | INTRAMUSCULAR | Status: AC
  Administered 2015-10-01: 0.5 mL via INTRAMUSCULAR
  Filled 2015-10-01: qty 0.5

## 2015-10-01 MED ORDER — NAPROXEN 500 MG PO TABS: 500.0000 mg | ORAL_TABLET | Freq: Two times a day (BID) | ORAL | Status: DC

## 2015-10-01 MED ORDER — HYDROCODONE-ACETAMINOPHEN 5-325 MG PO TABS: 1.0000 | ORAL_TABLET | Freq: Four times a day (QID) | ORAL | Status: DC | PRN
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
## 2015-10-01 NOTE — Discharge Instructions (Signed)
Facial Laceration ° A facial laceration is a cut on the face. These injuries can be painful and cause bleeding. Lacerations usually heal quickly, but they need special care to reduce scarring. °DIAGNOSIS  °Your health care provider will take a medical history, ask for details about how the injury occurred, and examine the wound to determine how deep the cut is. °TREATMENT  °Some facial lacerations may not require closure. Others may not be able to be closed because of an increased risk of infection. The risk of infection and the chance for successful closure will depend on various factors, including the amount of time since the injury occurred. °The wound may be cleaned to help prevent infection. If closure is appropriate, pain medicines may be given if needed. Your health care provider will use stitches (sutures), wound glue (adhesive), or skin adhesive strips to repair the laceration. These tools bring the skin edges together to allow for faster healing and a better cosmetic outcome. If needed, you may also be given a tetanus shot. °HOME CARE INSTRUCTIONS °· Only take over-the-counter or prescription medicines as directed by your health care provider. °· Follow your health care provider's instructions for wound care. These instructions will vary depending on the technique used for closing the wound. °For Sutures: °· Keep the wound clean and dry.   °· If you were given a bandage (dressing), you should change it at least once a day. Also change the dressing if it becomes wet or dirty, or as directed by your health care provider.   °· Wash the wound with soap and water 2 times a day. Rinse the wound off with water to remove all soap. Pat the wound dry with a clean towel.   °· After cleaning, apply a thin layer of the antibiotic ointment recommended by your health care provider. This will help prevent infection and keep the dressing from sticking.   °· You may shower as usual after the first 24 hours. Do not soak the  wound in water until the sutures are removed.   °· Get your sutures removed as directed by your health care provider. With facial lacerations, sutures should usually be taken out after 4-5 days to avoid stitch marks.   °· Wait a few days after your sutures are removed before applying any makeup. °For Skin Adhesive Strips: °· Keep the wound clean and dry.   °· Do not get the skin adhesive strips wet. You may bathe carefully, using caution to keep the wound dry.   °· If the wound gets wet, pat it dry with a clean towel.   °· Skin adhesive strips will fall off on their own. You may trim the strips as the wound heals. Do not remove skin adhesive strips that are still stuck to the wound. They will fall off in time.   °For Wound Adhesive: °· You may briefly wet your wound in the shower or bath. Do not soak or scrub the wound. Do not swim. Avoid periods of heavy sweating until the skin adhesive has fallen off on its own. After showering or bathing, gently pat the wound dry with a clean towel.   °· Do not apply liquid medicine, cream medicine, ointment medicine, or makeup to your wound while the skin adhesive is in place. This may loosen the film before your wound is healed.   °· If a dressing is placed over the wound, be careful not to apply tape directly over the skin adhesive. This may cause the adhesive to be pulled off before the wound is healed.   °· Avoid   prolonged exposure to sunlight or tanning lamps while the skin adhesive is in place.  The skin adhesive will usually remain in place for 5-10 days, then naturally fall off the skin. Do not pick at the adhesive film.  After Healing: Once the wound has healed, cover the wound with sunscreen during the day for 1 full year. This can help minimize scarring. Exposure to ultraviolet light in the first year will darken the scar. It can take 1-2 years for the scar to lose its redness and to heal completely.  SEEK MEDICAL CARE IF:  You have a fever. SEEK IMMEDIATE  MEDICAL CARE IF:  You have redness, pain, or swelling around the wound.   You see ayellowish-white fluid (pus) coming from the wound.    This information is not intended to replace advice given to you by your health care provider. Make sure you discuss any questions you have with your health care provider.   Document Released: 05/25/2004 Document Revised: 05/08/2014 Document Reviewed: 11/28/2012 Elsevier Interactive Patient Education 2016 [employer] Contusion An eye contusion is a deep bruise of the eye. This is often called a "black eye." Contusions are the result of an injury that caused bleeding under the skin. The contusion may turn blue, purple, or yellow. Minor injuries will give you a painless contusion, but more severe contusions may stay painful and swollen for a few weeks. If the eye contusion only involves the eyelids and tissues around the eye, the injured area will get better within a few days to weeks. However, eye contusions can be serious and affect the eyeball and sight. CAUSES   Blunt injury or trauma to the face or eye area.  A forehead injury that causes the blood under the skin to work its way down to the eyelids.  Rubbing the eyes due to irritation. SYMPTOMS   Swelling and redness around the eye.  Bruising around the eye.  Tenderness, soreness, or pain around the eye.  Blurry vision.  Tearing.  Eyeball redness. DIAGNOSIS  A diagnosis is usually based on a thorough exam of the eye and surrounding area. The eye must be looked at carefully to make sure it is not injured and to make sure nothing else will threaten your vision. A vision test may be done. An X-ray or computed tomography (CT) scan may be needed to determine if there are any associated injuries, such as broken bones (fractures). TREATMENT  If there is an injury to the eye, treatment will be determined by the nature of the injury. HOME CARE INSTRUCTIONS   Put ice on the injured  area.  Put ice in a plastic bag.  Place a towel between your skin and the bag.  Leave the ice on for 15-20 minutes, 03-04 times a day.  If it is determined that there is no injury to the eye, you may continue normal activities.  Sunglasses may be worn to protect your eyes from bright light if light is uncomfortable.  Sleep with your head elevated. You can put an extra pillow under your head. This may help with discomfort.  Only take over-the-counter or prescription medicines for pain, discomfort, or fever as directed by your caregiver. Do not take aspirin for the first few days. This may increase bruising. SEEK IMMEDIATE MEDICAL CARE IF:   You have any form of vision loss.  You have double vision.  You feel nauseous.  You feel dizzy, sleepy, or like you will faint.  You have any fluid  discharge from the eye or your nose.  You have swelling and discoloration that does not fade. MAKE SURE YOU:   Understand these instructions.  Will watch your condition.  Will get help right away if you are not doing well or get worse.   This information is not intended to replace advice given to you by your health care provider. Make sure you discuss any questions you have with your health care provider.   Document Released: 04/14/2000 Document Revised: 07/10/2011 Document Reviewed: 12/22/2014 Elsevier Interactive Patient Education 2016 [employer]  Subconjunctival Hemorrhage Subconjunctival hemorrhage is bleeding that happens between the white part of your eye (sclera) and the clear membrane that covers the outside of your eye (conjunctiva). There are many tiny blood vessels near the surface of your eye. A subconjunctival hemorrhage happens when one or more of these vessels breaks and bleeds, causing a red patch to appear on your eye. This is similar to a bruise. Depending on the amount of bleeding, the red patch may only cover a small area of your eye or it may cover the entire visible  part of the sclera. If a lot of blood collects under the conjunctiva, there may also be swelling. Subconjunctival hemorrhages do not affect your vision or cause pain, but your eye may feel irritated if there is swelling. Subconjunctival hemorrhages usually do not require treatment, and they disappear on their own within two weeks. CAUSES This condition may be caused by:  Mild trauma, such as rubbing your eye too hard.  Severe trauma or blunt injuries.  Coughing, sneezing, or vomiting.  Straining, such as when lifting a heavy object.  High blood pressure.  Recent eye surgery.  A history of diabetes.  Certain medicines, especially blood thinners (anticoagulants).  Other conditions, such as eye tumors, bleeding disorders, or blood vessel abnormalities. Subconjunctival hemorrhages can happen without an obvious cause.  SYMPTOMS  Symptoms of this condition include:  A bright red or dark red patch on the white part of the eye.  The red area may spread out to cover a larger area of the eye before it goes away.  The red area may turn brownish-yellow before it goes away.  Swelling.  Mild eye irritation. DIAGNOSIS This condition is diagnosed with a physical exam. If your subconjunctival hemorrhage was caused by trauma, your health care provider may refer you to an eye specialist (ophthalmologist) or another specialist to check for other injuries. You may have other tests, including:  An eye exam.  A blood pressure check.  Blood tests to check for bleeding disorders. If your subconjunctival hemorrhage was caused by trauma, X-rays or a CT scan may be done to check for other injuries. TREATMENT Usually, no treatment is needed. Your health care provider may recommend eye drops or cold compresses to help with discomfort. HOME CARE INSTRUCTIONS  Take over-the-counter and prescription medicines only as directed by your health care provider.  Use eye drops or cold compresses to help  with discomfort as directed by your health care provider.  Avoid activities, things, and environments that may irritate or injure your eye.  Keep all follow-up visits as told by your health care provider. This is important. SEEK MEDICAL CARE IF:  You have pain in your eye.  The bleeding does not go away within 3 weeks.  You keep getting new subconjunctival hemorrhages. SEEK IMMEDIATE MEDICAL CARE IF:  Your vision changes or you have difficulty seeing.  You suddenly develop severe sensitivity to light.  You develop  a severe headache, persistent vomiting, confusion, or abnormal tiredness (lethargy).  Your eye seems to bulge or protrude from your eye socket.  You develop unexplained bruises on your body.  You have unexplained bleeding in another area of your body.   This information is not intended to replace advice given to you by your health care provider. Make sure you discuss any questions you have with your health care provider.   Document Released: 04/17/2005 Document Revised: 01/06/2015 Document Reviewed: 06/24/2014 Elsevier Interactive Patient Education [employer].
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
## 2016-08-29 DIAGNOSIS — A64 Unspecified sexually transmitted disease: Secondary | ICD-10-CM

## 2016-08-29 HISTORY — DX: Unspecified sexually transmitted disease: A64

## 2016-08-31 NOTE — Progress Notes (Signed)
HPI:   [name] is a 26 y.o. female, who is here today with her boyfriend to establish care.  Former PCP: Dr Beather Arbour Last preventive routine visit: 2016 (?), gyn preventive care. Tdap 2008 Chronic medical problems: Elevated BP readings,UTI's,GERD among some.   Concerns today: "Pain management for period cramps", heavy menses, and would like to have test for STD's.   -For the past 2 months she has had heavier menstrual periods and dysmenorrhea. She has IUD and it has helped with menstrual flow and pelvic pain until 2 months ago. According to pt, it is due for change 11/2016. Menses lasting up to 2 weeks. No alleviating or exacerbating factors identified. Pelvic cramps, when asked about intensity between 1-10 she states that it is 15/10.Pain is intermittent, radiated to lower back.  OTC Midol OTC does not help.    Occasionally associated nausea.   LMP 08/08/16.  [facility]. Reporting Hx of abnormal pap smear 2010, colpo Bx was "fine", no treatment needed.  She also would like to have STD screening, specifically trichomonas. She was treated in 05/2016. She has not noted vaginal discharge,fever,chills,rash,or arthralgias.   -She started exercising and follow a healther diet about 2 month,already noted wt loss. FHX positive for DM II.  She would like IUD remove, interested in getting pregnant.    Review of Systems  Constitutional: Negative for activity change, appetite change, fatigue, fever and unexpected weight change.  HENT: Negative for mouth sores, nosebleeds and trouble swallowing.   Eyes: Negative for redness and visual disturbance.  Respiratory: Negative for cough, shortness of breath and wheezing.   Cardiovascular: Negative for chest pain, palpitations and leg swelling.  Gastrointestinal: Positive for abdominal pain. Negative for vomiting.       Negative for changes in bowel habits.  Endocrine:  Negative for cold intolerance, heat intolerance, polydipsia, polyphagia and polyuria.  Genitourinary: Positive for menstrual problem and pelvic pain. Negative for decreased urine volume, dysuria, genital sores, hematuria, vaginal discharge and vaginal pain.  Musculoskeletal: Positive for back pain. Negative for gait problem and myalgias.  Skin: Negative for pallor and rash.  Neurological: Negative for syncope, weakness and headaches.  Hematological: Negative for adenopathy. Does not bruise/bleed easily.  Psychiatric/Behavioral: Negative for confusion. The patient is not nervous/anxious.       No current outpatient prescriptions on file prior to visit.   No current facility-administered medications on file prior to visit.     Past Medical History:  Diagnosis Date  . Blood in stool   . Chicken pox   . GERD (gastroesophageal reflux disease)   . Urinary tract infection    No Known Allergies  Family History  Problem Relation Age of Onset  . Arthritis Mother   . Hypertension Mother   . Diabetes Mother   . Arthritis Father   . Heart disease Father   . Stroke Father   . Hypertension Father   . Diabetes Father   . Hypertension Maternal Grandmother   . Stroke Maternal Grandmother   . Hypertension Maternal Grandfather   . Stroke Maternal Grandfather   . Hypertension Paternal Grandmother   . Stroke Paternal Grandmother   . Hypertension Paternal Grandfather   . Stroke Paternal Grandfather     Social History   Social History  . Marital status: Single    Spouse name: N/A  . Number of children: N/A  . Years of education: N/A   Social History  Main Topics  . Smoking status: Never Smoker  . Smokeless tobacco: Never Used  . Alcohol use Yes  . Drug use: No  . Sexual activity: Yes   Other Topics Concern  . None   Social History Narrative  . None    Vitals:   09/01/16 0910  BP: 130/80  Pulse: 83  Resp: 12   O2 sat at RA 95% Body mass index is 48.09  kg/m.   Physical Exam  Nursing note and vitals reviewed. Constitutional: She is oriented to person, place, and time. She appears well-developed. No distress.  HENT:  Head: Atraumatic.  Mouth/Throat: Oropharynx is clear and moist and mucous membranes are normal.  Eyes: Conjunctivae and EOM are normal. Pupils are equal, round, and reactive to light.  Cardiovascular: Normal rate and regular rhythm.   No murmur heard. Pulses:      Dorsalis pedis pulses are 2+ on the right side, and 2+ on the left side.  Respiratory: Effort normal and breath sounds normal. No respiratory distress.  GI: Soft. She exhibits no mass. There is no hepatomegaly. There is no tenderness.  Genitourinary: There is no tenderness or lesion on the right labia. There is no tenderness or lesion on the left labia. Uterus is enlarged and tender. Cervix exhibits discharge. Cervix exhibits no motion tenderness and no friability. Right adnexum displays no tenderness and no fullness. Left adnexum displays no tenderness and no fullness. No erythema or tenderness in the vagina. Vaginal discharge found.  Genitourinary Comments: IUD string seen.  Musculoskeletal: She exhibits no edema or tenderness.  Lymphadenopathy:    She has no cervical adenopathy.  Neurological: She is alert and oriented to person, place, and time. She has normal strength. Coordination normal.  Skin: Skin is warm. No erythema.  Psychiatric: She has a normal mood and affect.  Well groomed, poor eye contact.    ASSESSMENT AND PLAN:   Linda Ochoa was seen today for establish care.  Diagnoses and all orders for this visit:  Lab Results  Component Value Date   HGBA1C 5.1 09/01/2016   Lab Results  Component Value Date   WBC 8.3 09/01/2016   HGB 13.7 09/01/2016   HCT 40.3 09/01/2016   MCV 93.4 09/01/2016   PLT 313.0 09/01/2016   Lab Results  Component Value Date   CREATININE 0.89 09/01/2016   BUN 13 09/01/2016   NA 137 09/01/2016   K 4.3 09/01/2016    CL 106 09/01/2016   CO2 22 09/01/2016   Lab Results  Component Value Date   TSH 1.02 09/01/2016    Dysfunctional uterine bleeding  Possible etiologies discussed including hormonal,infectious, fibroids among some. Instructed about warning signs. Further recommendations will be given according to labs results. Pregnancy test negative. Strongly recommend condom use for pregnancy prevention at least until she discuss this with gyn. Daily Folic acid also recommended.  -     CBC -     TSH -     POCT urine pregnancy -     Ambulatory referral to Gynecology -     Chlamydia/Gonococcus/Trichomonas, NAA  Class 3 obesity without serious comorbidity with body mass index (BMI) of 45.0 to 49.9 in adult, unspecified obesity type ([place])  We discussed benefits of wt loss as well as adverse effects of obesity. Consistency with healthy diet and physical activity recommended.   -     Basic metabolic panel -     Hemoglobin A1c  Dysmenorrhea, unspecified  Ibuprofen recommended to take with food and as  needed.   -     POCT urine pregnancy -     ibuprofen (ADVIL,MOTRIN) 600 MG tablet; Take 1 tablet (600 mg total) by mouth every 8 (eight) hours as needed for cramping (menses). -     Ambulatory referral to Gynecology  Screen for STD (sexually transmitted disease)  Further recommendations will be given according to lab results. Reporting HIV done in 05/2016 NR. STD prevention discussed.   -     Chlamydia/Gonococcus/Trichomonas, NAA  Need for tetanus, diphtheria, and acellular pertussis (Tdap) vaccine -     Tdap vaccine greater than or equal to 7yo IM      Makalynn Berwanger G. Martinique, MD  [facility]. [place] office.

## 2016-09-01 ENCOUNTER — Other Ambulatory Visit: Payer: Self-pay | Admitting: Family Medicine

## 2016-09-01 ENCOUNTER — Ambulatory Visit (INDEPENDENT_AMBULATORY_CARE_PROVIDER_SITE_OTHER): Payer: 59 | Admitting: Family Medicine

## 2016-09-01 ENCOUNTER — Encounter: Payer: Self-pay | Admitting: Family Medicine

## 2016-09-01 VITALS — BP 130/80 | HR 83 | Resp 12 | Ht 70.0 in | Wt 335.1 lb

## 2016-09-01 DIAGNOSIS — Z6841 Body Mass Index (BMI) 40.0 and over, adult: Secondary | ICD-10-CM | POA: Diagnosis not present

## 2016-09-01 DIAGNOSIS — N938 Other specified abnormal uterine and vaginal bleeding: Secondary | ICD-10-CM

## 2016-09-01 DIAGNOSIS — IMO0001 Reserved for inherently not codable concepts without codable children: Secondary | ICD-10-CM

## 2016-09-01 DIAGNOSIS — Z23 Encounter for immunization: Secondary | ICD-10-CM

## 2016-09-01 DIAGNOSIS — N946 Dysmenorrhea, unspecified: Secondary | ICD-10-CM

## 2016-09-01 DIAGNOSIS — E669 Obesity, unspecified: Secondary | ICD-10-CM

## 2016-09-01 DIAGNOSIS — Z113 Encounter for screening for infections with a predominantly sexual mode of transmission: Secondary | ICD-10-CM

## 2016-09-01 LAB — CBC
HEMATOCRIT: 40.3 % (ref 36.0–46.0)
Hemoglobin: 13.7 g/dL (ref 12.0–15.0)
MCHC: 33.9 g/dL (ref 30.0–36.0)
MCV: 93.4 fl (ref 78.0–100.0)
Platelets: 313 10*3/uL (ref 150.0–400.0)
RBC: 4.31 Mil/uL (ref 3.87–5.11)
RDW: 12.7 % (ref 11.5–15.5)
WBC: 8.3 10*3/uL (ref 4.0–10.5)

## 2016-09-01 LAB — BASIC METABOLIC PANEL
BUN: 13 mg/dL (ref 6–23)
CALCIUM: 9.7 mg/dL (ref 8.4–10.5)
CO2: 22 mEq/L (ref 19–32)
Chloride: 106 mEq/L (ref 96–112)
Creatinine, Ser: 0.89 mg/dL (ref 0.40–1.20)
GFR: 97.02 mL/min (ref >60.00)
Glucose, Bld: 101 mg/dL — ABNORMAL HIGH (ref 70–99)
Potassium: 4.3 mEq/L (ref 3.5–5.1)
SODIUM: 137 meq/L (ref 135–145)

## 2016-09-01 LAB — TSH: TSH: 1.02 u[IU]/mL (ref 0.35–4.50)

## 2016-09-01 LAB — HEMOGLOBIN A1C: HEMOGLOBIN A1C: 5.1 % (ref 4.6–6.5)

## 2016-09-01 LAB — POCT URINE PREGNANCY: PREG TEST UR: NEGATIVE

## 2016-09-01 MED ORDER — IBUPROFEN 600 MG PO TABS
600.0000 mg | ORAL_TABLET | Freq: Three times a day (TID) | ORAL | 1 refills | Status: AC | PRN
Start: 2016-09-01 — End: 2016-09-21

## 2016-09-01 NOTE — Patient Instructions (Addendum)
A few things to remember from today's visit:   Dysfunctional uterine bleeding - Plan: CBC, Basic metabolic panel, Hemoglobin A1c, TSH, POCT urine pregnancy, Ambulatory referral to Gynecology, Chlamydia/Gonococcus/Trichomonas, NAA  Class 3 obesity without serious comorbidity with body mass index (BMI) of 45.0 to 49.9 in adult, unspecified obesity type ([place])  Dysmenorrhea, unspecified - Plan: POCT urine pregnancy, Ambulatory referral to Gynecology  Screen for STD (sexually transmitted disease) - Plan: Chlamydia/Gonococcus/Trichomonas, NAA  Daily Folic acid 1 mg daily. Condom use until gyn appointment.  Continue working on weight loss through a healthy diet and regular physical activity.  Please be sure medication list is accurate. If a new problem present, please set up appointment sooner than planned today.

## 2016-09-04 ENCOUNTER — Telehealth: Payer: Self-pay | Admitting: Obstetrics and Gynecology

## 2016-09-04 NOTE — Telephone Encounter (Signed)
Called and left a message for patient to call back to schedule a new patient doctor referral. °

## 2016-09-05 LAB — CHLAMYDIA/GONOCOCCUS/TRICHOMONAS, NAA
Chlamydia by NAA: NEGATIVE
GONOCOCCUS BY NAA: NEGATIVE
TRICH VAG BY NAA: POSITIVE — AB

## 2016-09-06 ENCOUNTER — Other Ambulatory Visit: Payer: Self-pay

## 2016-09-06 MED ORDER — METRONIDAZOLE 500 MG PO TABS: 500.0000 mg | ORAL_TABLET | Freq: Two times a day (BID) | ORAL | 0 refills | Status: DC

## 2016-09-07 NOTE — Telephone Encounter (Signed)
Called and left a message for patient to call back to schedule a new patient doctor referral. °

## 2016-09-11 NOTE — Telephone Encounter (Signed)
Called and left a message for patient to call back to schedule a new patient doctor referral. °

## 2016-09-12 NOTE — Telephone Encounter (Signed)
Routing referral back to referring office as patient has not returned my calls to schedule an appointment.

## 2016-09-20 ENCOUNTER — Telehealth: Payer: Self-pay | Admitting: Obstetrics and Gynecology

## 2016-09-20 NOTE — Telephone Encounter (Signed)
Called and left a message for patient to call back to schedule a new patient doctor referral. Patient will need lab tests at initial visit prior to an appointment for Mirena removal/reinsertion.

## 2016-09-22 NOTE — Telephone Encounter (Signed)
Called and left a message for patient to call back to schedule a new patient doctor referral. °

## 2016-09-27 NOTE — Telephone Encounter (Signed)
°  Message to referring office: This patient has not returned multiple calls to schedule an appointment with our office. I am routing this referral back to the referring office for follow up. Please resend referral when patient is ready to schedule an appointment.

## 2016-10-19 ENCOUNTER — Other Ambulatory Visit (HOSPITAL_COMMUNITY)
Admission: RE | Admit: 2016-10-19 | Discharge: 2016-10-19 | Disposition: A | Discharge status: Home / Self Care | Payer: 59 | Source: Ambulatory Visit | Attending: Obstetrics and Gynecology | Admitting: Obstetrics and Gynecology

## 2016-10-19 ENCOUNTER — Ambulatory Visit (INDEPENDENT_AMBULATORY_CARE_PROVIDER_SITE_OTHER): Payer: 59 | Admitting: Obstetrics and Gynecology

## 2016-10-19 ENCOUNTER — Encounter: Payer: Self-pay | Admitting: Obstetrics and Gynecology

## 2016-10-19 VITALS — BP 120/82 | HR 72 | Resp 16 | Ht 69.25 in | Wt 342.0 lb

## 2016-10-19 DIAGNOSIS — Z8619 Personal history of other infectious and parasitic diseases: Secondary | ICD-10-CM | POA: Diagnosis not present

## 2016-10-19 DIAGNOSIS — Z30431 Encounter for routine checking of intrauterine contraceptive device: Secondary | ICD-10-CM | POA: Diagnosis not present

## 2016-10-19 DIAGNOSIS — N946 Dysmenorrhea, unspecified: Secondary | ICD-10-CM | POA: Diagnosis not present

## 2016-10-19 DIAGNOSIS — N852 Hypertrophy of uterus: Secondary | ICD-10-CM

## 2016-10-19 DIAGNOSIS — Z01419 Encounter for gynecological examination (general) (routine) without abnormal findings: Secondary | ICD-10-CM | POA: Diagnosis not present

## 2016-10-19 DIAGNOSIS — Z124 Encounter for screening for malignant neoplasm of cervix: Secondary | ICD-10-CM | POA: Diagnosis not present

## 2016-10-19 DIAGNOSIS — Z113 Encounter for screening for infections with a predominantly sexual mode of transmission: Secondary | ICD-10-CM

## 2016-10-19 DIAGNOSIS — N920 Excessive and frequent menstruation with regular cycle: Secondary | ICD-10-CM | POA: Diagnosis not present

## 2016-10-19 MED ORDER — NAPROXEN SODIUM 550 MG PO TABS: 550.0000 mg | ORAL_TABLET | Freq: Two times a day (BID) | ORAL | 2 refills | Status: DC

## 2016-10-19 NOTE — Patient Instructions (Signed)
EXERCISE AND DIET:  We recommended that you start or continue a regular exercise program for good health. Regular exercise means any activity that makes your heart beat faster and makes you sweat.  We recommend exercising at least 30 minutes per day at least 3 days a week, preferably 4 or 5.  We also recommend a diet low in fat and sugar.  Inactivity, poor dietary choices and obesity can cause diabetes, heart attack, stroke, and kidney damage, among others.    ALCOHOL AND SMOKING:  Women should limit their alcohol intake to no more than 7 drinks/beers/glasses of wine (combined, not each!) per week. Moderation of alcohol intake to this level decreases your risk of breast cancer and liver damage. And of course, no recreational drugs are part of a healthy lifestyle.  And absolutely no smoking or even second hand smoke. Most people know smoking can cause heart and lung diseases, but did you know it also contributes to weakening of your bones? Aging of your skin?  Yellowing of your teeth and nails?  CALCIUM AND VITAMIN D:  Adequate intake of calcium and Vitamin D are recommended.  The recommendations for exact amounts of these supplements seem to change often, but generally speaking 600 mg of calcium (either carbonate or citrate) and 800 units of Vitamin D per day seems prudent. Certain women may benefit from higher intake of Vitamin D.  If you are among these women, your doctor will have told you during your visit.    PAP SMEARS:  Pap smears, to check for cervical cancer or precancers,  have traditionally been done yearly, although recent scientific advances have shown that most women can have pap smears less often.  However, every woman still should have a physical exam from her gynecologist every year. It will include a breast check, inspection of the vulva and vagina to check for abnormal growths or skin changes, a visual exam of the cervix, and then an exam to evaluate the size and shape of the uterus and  ovaries.  And after 28 years of age, a rectal exam is indicated to check for rectal cancers. We will also provide age appropriate advice regarding health maintenance, like when you should have certain vaccines, screening for sexually transmitted diseases, bone density testing, colonoscopy, mammograms, etc.   MAMMOGRAMS:  All women over 40 years old should have a yearly mammogram. Many facilities now offer a "3D" mammogram, which may cost around $50 extra out of pocket. If possible,  we recommend you accept the option to have the 3D mammogram performed.  It both reduces the number of women who will be called back for extra views which then turn out to be normal, and it is better than the routine mammogram at detecting truly abnormal areas.    COLONOSCOPY:  Colonoscopy to screen for colon cancer is recommended for all women at age 50.  We know, you hate the idea of the prep.  We agree, BUT, having colon cancer and not knowing it is worse!!  Colon cancer so often starts as a polyp that can be seen and removed at colonscopy, which can quite literally save your life!  And if your first colonoscopy is normal and you have no family history of colon cancer, most women don't have to have it again for 10 years.  Once every ten years, you can do something that may end up saving your life, right?  We will be happy to help you get it scheduled when you are ready.    Be sure to check your insurance coverage so you understand how much it will cost.  It may be covered as a preventative service at no cost, but you should check your particular policy.      Uterine Fibroids Uterine fibroids are tissue masses (tumors) that can develop in the womb (uterus). They are also called leiomyomas. This type of tumor is not cancerous (benign) and does not spread to other parts of the body outside of the pelvic area, which is between the hip bones. Occasionally, fibroids may develop in the fallopian tubes, in the cervix, or on the support  structures (ligaments) that surround the uterus. You can have one or many fibroids. Fibroids can vary in size, weight, and where they grow in the uterus. Some can become quite large. Most fibroids do not require medical treatment. What are the causes? A fibroid can develop when a single uterine cell keeps growing (replicating). Most cells in the human body have a control mechanism that keeps them from replicating without control. What are the signs or symptoms? Symptoms may include:  Heavy bleeding during your period.  Bleeding or spotting between periods.  Pelvic pain and pressure.  Bladder problems, such as needing to urinate more often (urinary frequency) or urgently.  Inability to reproduce offspring (infertility).  Miscarriages.  How is this diagnosed? Uterine fibroids are diagnosed through a physical exam. Your health care provider may feel the lumpy tumors during a pelvic exam. Ultrasonography and an MRI may be done to determine the size, location, and number of fibroids. How is this treated? Treatment may include:  Watchful waiting. This involves getting the fibroid checked by your health care provider to see if it grows or shrinks. Follow your health care provider's recommendations for how often to have this checked.  Hormone medicines. These can be taken by mouth or given through an intrauterine device (IUD).  Surgery. ? Removing the fibroids (myomectomy) or the uterus (hysterectomy). ? Removing blood supply to the fibroids (uterine artery embolization).  If fibroids interfere with your fertility and you want to become pregnant, your health care provider may recommend having the fibroids removed. Follow these instructions at home:  Keep all follow-up visits as directed by your health care provider. This is important.  Take over-the-counter and prescription medicines only as told by your health care provider. ? If you were prescribed a hormone treatment, take the  hormone medicines exactly as directed.  Ask your health care provider about taking iron pills and increasing the amount of dark green, leafy vegetables in your diet. These actions can help to boost your blood iron levels, which may be affected by heavy menstrual bleeding.  Pay close attention to your period and tell your health care provider about any changes, such as: ? Increased blood flow that requires you to use more pads or tampons than usual per month. ? A change in the number of days that your period lasts per month. ? A change in symptoms that are associated with your period, such as abdominal cramping or back pain. Contact a health care provider if:  You have pelvic pain, back pain, or abdominal cramps that cannot be controlled with medicines.  You have an increase in bleeding between and during periods.  You soak tampons or pads in a half hour or less.  You feel lightheaded, extra tired, or weak. Get help right away if:  You faint.  You have a sudden increase in pelvic pain. This information is not intended to replace   advice given to you by your health care provider. Make sure you discuss any questions you have with your health care provider. Document Released: 04/14/2000 Document Revised: 12/16/2015 Document Reviewed: 10/14/2013 Elsevier Interactive Patient Education  2018 Elsevier Inc.  

## 2016-10-19 NOTE — Progress Notes (Signed)
26 y.o. Linda Ochoa SingleAfrican AmericanF here for IUD consult from Dr Betty Martinique. Patient is c/o irregular menstrual cycles.    She had a mirena IUD inserted in 8/13. Initially she skipped some cycles. Within a year she was having monthly cycles.  Prior to the last 3 months cycles were monthly x 7 days. Saturating a pad in 3 hours. She was having intermittent cramping, not as bad as prior to the IUD. For the last 3 months, cycles are q month x 2 weeks, very heavy and very crampy. Saturating an over night pad in 1-1.5 hours.  She is sexually active, same partner for about a year. They are serious.  Normal TSH and CBC in early May, 2018. She was treated for trich early last month. Negative testing for GC/Chlamydia at that time.   She is overdue for an annual exam.   Period Duration (Days): 7-14 days  Period Pattern: (!) Irregular Menstrual Flow: Moderate, Heavy Menstrual Control: Maxi pad Menstrual Control Change Freq (Hours): changes overight pad every hour Dysmenorrhea: (!) Severe Dysmenorrhea Symptoms: Cramping, Diarrhea, Headache  Patient's last menstrual period was 09/29/2016 (exact date).          Sexually active: Yes.    The current method of family planning is IUD (Mirena).    Exercising: Yes.    cardio 3 times a week Smoker:  no  Health Maintenance: [facility]  [facility]  History of abnormal Pap:  Yes unsure of results - had Colposcopy in 2012, no surgery on her cervix.  TDaP:  09-01-16 Gardasil: completed all 3    reports that she has never smoked. She has never used smokeless tobacco. She reports that she drinks about 0.6 oz of alcohol per week . She reports that she does not use drugs. She is working as a Librarian, academic. She has her degree in counseling, would like to do that.   Past Medical History:  Diagnosis Date  . Anemia   . Blood in stool   . Chicken pox   . GERD (gastroesophageal reflux disease)   . STD (sexually transmitted  disease) 08/2016   Trich   . Urinary tract infection     Past Surgical History:  Procedure Laterality Date  . COLPOSCOPY    . gallstones      Current Outpatient Prescriptions  Medication Sig Dispense Refill  . folic acid (FOLVITE) 0.5 MG tablet Take 0.5 mg by mouth daily.     No current facility-administered medications for this visit.     Family History  Problem Relation Age of Onset  . Arthritis Mother   . Hypertension Mother   . Diabetes Mother   . Arthritis Father   . Heart disease Father   . Stroke Father   . Hypertension Father   . Diabetes Father   . Hypertension Maternal Grandmother   . Stroke Maternal Grandmother   . Heart disease Maternal Grandmother   . Hypertension Maternal Grandfather   . Stroke Maternal Grandfather   . Heart disease Maternal Grandfather   . Hypertension Paternal Grandmother   . Stroke Paternal Grandmother   . Heart disease Paternal Grandmother   . Hypertension Paternal Grandfather   . Stroke Paternal Grandfather   . Heart disease Paternal Grandfather     Review of Systems  Exam:   BP 120/82 (BP Location: Right Arm, Patient Position: Sitting, Cuff Size: Large)   Pulse 72   Resp 16   Ht 5' 9.25" (1.759 m)  Wt (!) 342 lb (155.1 kg)   LMP 09/29/2016 (Exact Date)   BMI 50.14 kg/m   Weight change: @WEIGHTCHANGE @ Height:   Height: 5' 9.25" (175.9 cm)  Ht Readings from Last 3 Encounters:  10/19/16 5' 9.25" (1.759 m)  09/01/16 5\' 10"  (1.778 m)    General appearance: alert, cooperative and appears stated age Head: Normocephalic, without obvious abnormality, atraumatic Neck: no adenopathy, supple, symmetrical, trachea midline and thyroid normal to inspection and palpation Lungs: clear to auscultation bilaterally Cardiovascular: regular rate and rhythm Breasts: normal appearance, no masses or tenderness Abdomen: soft, non-tender; a mass is palpated in her lower abdomen in the midline and RLQ, not tender. Extremities: extremities  normal, atraumatic, no cyanosis or edema Skin: Skin color, texture, turgor normal. No rashes or lesions Lymph nodes: Cervical, supraclavicular, and axillary nodes normal. No abnormal inguinal nodes palpated Neurologic: Grossly normal   Pelvic: External genitalia:  no lesions              Urethra:  normal appearing urethra with no masses, tenderness or lesions              Bartholins and Skenes: normal                 Vagina: normal appearing vagina with normal color and discharge, no lesions              Cervix: no lesions and IUD string 3-4 cm               Bimanual Exam:  Uterus:  anteverted, irregularly shaped, enlarged, approximately 10 week sized. Protruding more on the right side. Not tender.              Adnexa: no mass, fullness, tenderness               Rectovaginal: Confirms               Anus:  normal sphincter tone, no lesions  Chaperone was present for exam.  A:  Well Woman with normal exam  New onset menorrhagia, exam c/w fibroid uterus  IUD check, initially good cycle control with the IUD  Dysmenorrhea  H/O trich last month, treated  P:   Pap with reflex hpv  Trich  STD testing (other than GC/Chlamydia, that was recently negative)  Return for a GYN ultrasound  Will check another CBC and ferritin, concerned she is going to develop anemia  After her ultrasound will discuss options for contraception, she would like another mirena (depends on the ultrasound results)  We discussed that if her uterine cavity is too large, the IUD will not give her the same contraceptive efficacy.   CC: Betty Martinique, MD

## 2016-10-20 LAB — HEP, RPR, HIV PANEL
HIV SCREEN 4TH GENERATION: NONREACTIVE
Hepatitis B Surface Ag: NEGATIVE
RPR: NONREACTIVE

## 2016-10-20 LAB — CBC
HEMATOCRIT: 37.5 % (ref 34.0–46.6)
HEMOGLOBIN: 12.5 g/dL (ref 11.1–15.9)
MCH: 30.9 pg (ref 26.6–33.0)
MCHC: 33.3 g/dL (ref 31.5–35.7)
MCV: 93 fL (ref 79–97)
Platelets: 320 10*3/uL (ref 150–379)
RBC: 4.04 x10E6/uL (ref 3.77–5.28)
RDW: 13.2 % (ref 12.3–15.4)
WBC: 8.3 10*3/uL (ref 3.4–10.8)

## 2016-10-20 LAB — FERRITIN: FERRITIN: 98 ng/mL (ref 15–150)

## 2016-10-20 LAB — HEPATITIS C ANTIBODY

## 2016-10-23 LAB — CYTOLOGY - PAP
Diagnosis: NEGATIVE
Trichomonas: POSITIVE — AB

## 2016-10-24 ENCOUNTER — Telehealth: Payer: Self-pay

## 2016-10-24 NOTE — Telephone Encounter (Signed)
-----   Message from Salvadore Dom, MD sent at 10/23/2016  5:08 PM EDT ----- Please let the patient know that her pap was negative for dysplasia, but + for trich and ? BV. She was treated for trich last month. Please treat with flagyl 500 mg po BID x 7 days. 2 weeks after completing treatment she needs another TOC for trich. Her partner needs to be treated. They should avoid intercourse until 1 week after they have both been treated and then use condoms.  02 recall for the pap

## 2016-10-24 NOTE — Telephone Encounter (Signed)
Left message to call Kaitlyn at [phone]. 

## 2016-10-26 MED ORDER — METRONIDAZOLE 500 MG PO TABS: 500.0000 mg | ORAL_TABLET | Freq: Two times a day (BID) | ORAL | 0 refills | Status: DC

## 2016-10-26 NOTE — Telephone Encounter (Signed)
Spoke with patient. Advised of results as seen below from [place]. Patient is agreeable and verbalizes understanding. Rx for Flagyl 500 mg BID x 7 days #14 0RF sent to pharmacy on file. Aware she will need to abstain from intercourse until she and her partner have received treatment and for 1 week following. Will need to use condoms for protection against STD's with intercourse. Patient verbalizes understanding. 02 recall placed.  Routing to provider for final review. Patient agreeable to disposition. Will close encounter.

## 2016-10-29 HISTORY — PX: INTRAUTERINE DEVICE (IUD) INSERTION: SHX5877

## 2016-10-31 ENCOUNTER — Other Ambulatory Visit: Payer: 59 | Admitting: Obstetrics and Gynecology

## 2016-10-31 ENCOUNTER — Other Ambulatory Visit: Payer: 59

## 2016-10-31 ENCOUNTER — Encounter: Payer: Self-pay | Admitting: Obstetrics and Gynecology

## 2016-10-31 ENCOUNTER — Telehealth: Payer: Self-pay | Admitting: Obstetrics and Gynecology

## 2016-10-31 NOTE — Telephone Encounter (Signed)
Patient dnka her PUS appointment today. I called patient and she was under the impression that this appointment had been rescheduled to next Thursday 11/09/16. Patient thought she was having the recheck and PUS at the same time. Patient would like to reschedule. [facility] policy followed.

## 2016-11-02 NOTE — Telephone Encounter (Signed)
This is Dr. Quincy Simmonds helping out Dr. Gentry Fitz in box. Patient may have trichomonas testing on the same day but this will need to be done prior to the ultrasound because of the use of gel.  Please mark the chart so that everyone is aware.   Cc - Dr. Talbert Nan

## 2016-11-02 NOTE — Telephone Encounter (Signed)
Routing to [place] for review. Dr.Jertson is out of the office today.

## 2016-11-02 NOTE — Telephone Encounter (Signed)
Dr.Jertson, patient needs to have PUS evaluation and recheck for positive trichomonas testing. She has an appointment on 7/12 for trichomonas retesting. Okay to have PUS same day? Or should this be scheduled on a different day so that we can receive results?

## 2016-11-03 NOTE — Telephone Encounter (Signed)
Patient notified can have both TOC and PUS on same day. Ultrasound added at 130 pm. Patient agreeable.

## 2016-11-09 ENCOUNTER — Ambulatory Visit (INDEPENDENT_AMBULATORY_CARE_PROVIDER_SITE_OTHER): Payer: 59 | Admitting: Obstetrics and Gynecology

## 2016-11-09 ENCOUNTER — Ambulatory Visit (INDEPENDENT_AMBULATORY_CARE_PROVIDER_SITE_OTHER): Payer: 59

## 2016-11-09 VITALS — BP 124/86 | HR 98 | Resp 16 | Ht 69.25 in | Wt 341.0 lb

## 2016-11-09 DIAGNOSIS — N920 Excessive and frequent menstruation with regular cycle: Secondary | ICD-10-CM | POA: Diagnosis not present

## 2016-11-09 DIAGNOSIS — T8332XA Displacement of intrauterine contraceptive device, initial encounter: Secondary | ICD-10-CM

## 2016-11-09 DIAGNOSIS — N852 Hypertrophy of uterus: Secondary | ICD-10-CM | POA: Diagnosis not present

## 2016-11-09 DIAGNOSIS — Z8619 Personal history of other infectious and parasitic diseases: Secondary | ICD-10-CM | POA: Diagnosis not present

## 2016-11-09 DIAGNOSIS — Z30431 Encounter for routine checking of intrauterine contraceptive device: Secondary | ICD-10-CM

## 2016-11-09 DIAGNOSIS — N946 Dysmenorrhea, unspecified: Secondary | ICD-10-CM | POA: Diagnosis not present

## 2016-11-09 DIAGNOSIS — T8389XA Other specified complication of genitourinary prosthetic devices, implants and grafts, initial encounter: Secondary | ICD-10-CM

## 2016-11-09 DIAGNOSIS — Z30432 Encounter for removal of intrauterine contraceptive device: Secondary | ICD-10-CM

## 2016-11-09 DIAGNOSIS — D251 Intramural leiomyoma of uterus: Secondary | ICD-10-CM

## 2016-11-09 LAB — POCT URINE PREGNANCY: Preg Test, Ur: NEGATIVE

## 2016-11-09 MED ORDER — MISOPROSTOL 200 MCG PO TABS
ORAL_TABLET | ORAL | 0 refills | Status: DC
Start: 2016-11-09 — End: 2017-02-28

## 2016-11-09 NOTE — Patient Instructions (Signed)
Uterine Fibroids Uterine fibroids are tissue masses (tumors) that can develop in the womb (uterus). They are also called leiomyomas. This type of tumor is not cancerous (benign) and does not spread to other parts of the body outside of the pelvic area, which is between the hip bones. Occasionally, fibroids may develop in the fallopian tubes, in the cervix, or on the support structures (ligaments) that surround the uterus. You can have one or many fibroids. Fibroids can vary in size, weight, and where they grow in the uterus. Some can become quite large. Most fibroids do not require medical treatment. What are the causes? A fibroid can develop when a single uterine cell keeps growing (replicating). Most cells in the human body have a control mechanism that keeps them from replicating without control. What are the signs or symptoms? Symptoms may include:  Heavy bleeding during your period.  Bleeding or spotting between periods.  Pelvic pain and pressure.  Bladder problems, such as needing to urinate more often (urinary frequency) or urgently.  Inability to reproduce offspring (infertility).  Miscarriages.  How is this diagnosed? Uterine fibroids are diagnosed through a physical exam. Your health care provider may feel the lumpy tumors during a pelvic exam. Ultrasonography and an MRI may be done to determine the size, location, and number of fibroids. How is this treated? Treatment may include:  Watchful waiting. This involves getting the fibroid checked by your health care provider to see if it grows or shrinks. Follow your health care provider's recommendations for how often to have this checked.  Hormone medicines. These can be taken by mouth or given through an intrauterine device (IUD).  Surgery. ? Removing the fibroids (myomectomy) or the uterus (hysterectomy). ? Removing blood supply to the fibroids (uterine artery embolization).  If fibroids interfere with your fertility and you  want to become pregnant, your health care provider may recommend having the fibroids removed. Follow these instructions at home:  Keep all follow-up visits as directed by your health care provider. This is important.  Take over-the-counter and prescription medicines only as told by your health care provider. ? If you were prescribed a hormone treatment, take the hormone medicines exactly as directed.  Ask your health care provider about taking iron pills and increasing the amount of dark green, leafy vegetables in your diet. These actions can help to boost your blood iron levels, which may be affected by heavy menstrual bleeding.  Pay close attention to your period and tell your health care provider about any changes, such as: ? Increased blood flow that requires you to use more pads or tampons than usual per month. ? A change in the number of days that your period lasts per month. ? A change in symptoms that are associated with your period, such as abdominal cramping or back pain. Contact a health care provider if:  You have pelvic pain, back pain, or abdominal cramps that cannot be controlled with medicines.  You have an increase in bleeding between and during periods.  You soak tampons or pads in a half hour or less.  You feel lightheaded, extra tired, or weak. Get help right away if:  You faint.  You have a sudden increase in pelvic pain. This information is not intended to replace advice given to you by your health care provider. Make sure you discuss any questions you have with your health care provider. Document Released: 04/14/2000 Document Revised: 12/16/2015 Document Reviewed: 10/14/2013 Elsevier Interactive Patient Education  2018 Elsevier Inc.  

## 2016-11-09 NOTE — Progress Notes (Signed)
GYNECOLOGY  VISIT   HPI: 26 y.o.   Single  African American  female   [id number] with Patient's last menstrual period was 10/29/2016.   here for [facility] trich. She was treated in 5/18 with flagyl. States she hasn't been sexually active since then. The patient has a mirena IUD that is due to come out next month. For the last 4 months her cycles have been monthly x 2 weeks, very heavy with severe cramps. The cramps are not helped with Anaprox. Negative genprobe, not anemic, normal ferritin.  Partner just finished his antibiotics for trich yesterday. She hasn't been sexually since she started treatment on 10/24/16.   GYNECOLOGIC HISTORY: Patient's last menstrual period was 10/29/2016. Contraception: Mirena IUD inserted 11/2011  Menopausal hormone therapy: none        OB History    Gravida Para Term Preterm AB Living   2       2     SAB TAB Ectopic Multiple Live Births                     Patient Active Problem List   Diagnosis Date Noted  . Class 3 obesity with body mass index (BMI) of 45.0 to 49.9 in adult [facility]) 09/01/2016    Past Medical History:  Diagnosis Date  . Anemia   . Blood in stool   . Chicken pox   . GERD (gastroesophageal reflux disease)   . STD (sexually transmitted disease) 08/2016   Trich   . Urinary tract infection     Past Surgical History:  Procedure Laterality Date  . COLPOSCOPY    . gallstones      Current Outpatient Prescriptions  Medication Sig Dispense Refill  . folic acid (FOLVITE) 0.5 MG tablet Take 0.5 mg by mouth daily.     No current facility-administered medications for this visit.      ALLERGIES: Patient has no known allergies.  Family History  Problem Relation Age of Onset  . Arthritis Mother   . Hypertension Mother   . Diabetes Mother   . Arthritis Father   . Heart disease Father   . Stroke Father   . Hypertension Father   . Diabetes Father   . Hypertension Maternal Grandmother   . Stroke Maternal Grandmother   . Heart disease  Maternal Grandmother   . Hypertension Maternal Grandfather   . Stroke Maternal Grandfather   . Heart disease Maternal Grandfather   . Hypertension Paternal Grandmother   . Stroke Paternal Grandmother   . Heart disease Paternal Grandmother   . Hypertension Paternal Grandfather   . Stroke Paternal Grandfather   . Heart disease Paternal Grandfather     Social History   Social History  . Marital status: Single    Spouse name: N/A  . Number of children: N/A  . Years of education: N/A   Occupational History  . Not on file.   Social History Main Topics  . Smoking status: Never Smoker  . Smokeless tobacco: Never Used  . Alcohol use 0.6 oz/week    1 Standard drinks or equivalent per week  . Drug use: No  . Sexual activity: Yes    Partners: Male    Birth control/ protection: IUD     Comment: MIrena    Other Topics Concern  . Not on file   Social History Narrative  . No narrative on file    Review of Systems  Constitutional: Negative.   HENT: Negative.   Eyes: Negative.  Respiratory: Negative.   Cardiovascular: Negative.   Gastrointestinal: Negative.   Genitourinary: Negative.   Musculoskeletal: Negative.   Skin: Negative.   Neurological: Negative.   Endo/Heme/Allergies: Negative.   Psychiatric/Behavioral: Negative.     PHYSICAL EXAMINATION:    BP 124/86 (BP Location: Left Arm, Patient Position: Sitting, Cuff Size: Large)   Pulse 98   Resp 16   Ht 5' 9.25" (1.759 m)   Wt (!) 341 lb (154.7 kg)   LMP 10/29/2016   BMI 49.99 kg/m     General appearance: alert, cooperative and appears stated age  Pelvic: External genitalia:  no lesions              Urethra:  normal appearing urethra with no masses, tenderness or lesions              Bartholins and Skenes: normal                 Vagina: normal appearing vagina with an increase in brown, watery vaginal d/c.              Cervix: no lesions, IUD string is 4-5 cm long Chaperone was present for exam.  Ultrasound:  see report, 11 cm fibroid, not impinging on the endometrium. IUD in LUS.   Repeat exam done: IUD removed with ringed forceps  ASSESSMENT H/O trich, s/p treatment Menorrhagia, not anemic Severe dysmeorrhea IUD check, due for new IUD next month. IUD was not in place, it was in the LUS. IUD removed.  Fibroid uterus    PLAN Test of cure for trich, Affirm sent The patient hasnt' been sexually active for over 2 weeks, will check UPT Will plan on having her return next week for Mirena IUD insertion, she will not have sex until after IUD insertion, will pre-treat with cytotec and check IUD location with IUD Discussed fibroids, reading information given (ACOG, UTD and Epic)    An After Visit Summary was printed and given to the patient.  25 minutes face to face time of which over 50% was spent in counseling.

## 2016-11-10 ENCOUNTER — Encounter: Payer: Self-pay | Admitting: Obstetrics and Gynecology

## 2016-11-10 LAB — VAGINITIS/VAGINOSIS, DNA PROBE
Candida Species: NEGATIVE
Gardnerella vaginalis: POSITIVE — AB
TRICHOMONAS VAG: NEGATIVE

## 2016-11-13 ENCOUNTER — Encounter: Payer: Self-pay | Admitting: Obstetrics and Gynecology

## 2016-11-13 ENCOUNTER — Telehealth: Payer: Self-pay | Admitting: Obstetrics and Gynecology

## 2016-11-13 ENCOUNTER — Ambulatory Visit (INDEPENDENT_AMBULATORY_CARE_PROVIDER_SITE_OTHER): Payer: 59 | Admitting: Obstetrics and Gynecology

## 2016-11-13 VITALS — BP 124/80 | HR 92 | Resp 15 | Wt 343.0 lb

## 2016-11-13 DIAGNOSIS — Z308 Encounter for other contraceptive management: Secondary | ICD-10-CM | POA: Diagnosis not present

## 2016-11-13 DIAGNOSIS — R300 Dysuria: Secondary | ICD-10-CM | POA: Diagnosis not present

## 2016-11-13 DIAGNOSIS — B9689 Other specified bacterial agents as the cause of diseases classified elsewhere: Secondary | ICD-10-CM

## 2016-11-13 DIAGNOSIS — N760 Acute vaginitis: Secondary | ICD-10-CM

## 2016-11-13 DIAGNOSIS — N3090 Cystitis, unspecified without hematuria: Secondary | ICD-10-CM

## 2016-11-13 DIAGNOSIS — D259 Leiomyoma of uterus, unspecified: Secondary | ICD-10-CM | POA: Diagnosis not present

## 2016-11-13 DIAGNOSIS — N762 Acute vulvitis: Secondary | ICD-10-CM | POA: Diagnosis not present

## 2016-11-13 LAB — POCT URINALYSIS DIPSTICK
BILIRUBIN UA: NEGATIVE
GLUCOSE UA: NEGATIVE
KETONES UA: NEGATIVE
Nitrite, UA: NEGATIVE
Protein, UA: NEGATIVE
Urobilinogen, UA: NEGATIVE E.U./dL — AB
pH, UA: 7 (ref 5.0–8.0)

## 2016-11-13 MED ORDER — METRONIDAZOLE 500 MG PO TABS: 500.0000 mg | ORAL_TABLET | Freq: Two times a day (BID) | ORAL | 0 refills | Status: DC

## 2016-11-13 MED ORDER — BETAMETHASONE VALERATE 0.1 % EX OINT: 1.0000 "application " | TOPICAL_OINTMENT | Freq: Two times a day (BID) | CUTANEOUS | 0 refills | Status: DC

## 2016-11-13 MED ORDER — PHENAZOPYRIDINE HCL 200 MG PO TABS: 200.0000 mg | ORAL_TABLET | Freq: Three times a day (TID) | ORAL | 0 refills | Status: DC | PRN

## 2016-11-13 MED ORDER — SULFAMETHOXAZOLE-TRIMETHOPRIM 800-160 MG PO TABS: 1.0000 | ORAL_TABLET | Freq: Two times a day (BID) | ORAL | 0 refills | Status: DC

## 2016-11-13 NOTE — Telephone Encounter (Signed)
Patient was seen today by Dr. Talbert Nan. She said her medications prescribed today were sent to the wrong pharmacy. She'd like them rerouted to the pharmacy below. The pharmacy declined to assist her with the transfer and told her to call us directly.  [facility] on [address]

## 2016-11-13 NOTE — Addendum Note (Signed)
Addended by: Susanne Greenhouse E on: 11/13/2016 04:05 PM   Modules accepted: Orders

## 2016-11-13 NOTE — Patient Instructions (Addendum)
Vaginitis Vaginitis is a condition in which the vaginal tissue swells and becomes red (inflamed). This condition is most often caused by a change in the normal balance of bacteria and yeast that live in the vagina. This change causes an overgrowth of certain bacteria or yeast, which causes the inflammation. There are different types of vaginitis, but the most common types are:  Bacterial vaginosis.  Yeast infection (candidiasis).  Trichomoniasis vaginitis. This is a sexually transmitted disease (STD).  Viral vaginitis.  Atrophic vaginitis.  Allergic vaginitis.  What are the causes? The cause of this condition depends on the type of vaginitis. It can be caused by:  Bacteria (bacterial vaginosis).  Yeast, which is a fungus (yeast infection).  A parasite (trichomoniasis vaginitis).  A virus (viral vaginitis).  Low hormone levels (atrophic vaginitis). Low hormone levels can occur during pregnancy, breastfeeding, or after menopause.  Irritants, such as bubble baths, scented tampons, and feminine sprays (allergic vaginitis).  Other factors can change the normal balance of the yeast and bacteria that live in the vagina. These include:  Antibiotic medicines.  Poor hygiene.  Diaphragms, vaginal sponges, spermicides, birth control pills, and intrauterine devices (IUD).  Sex.  Infection.  Uncontrolled diabetes.  A weakened defense (immune) system.  What increases the risk? This condition is more likely to develop in women who:  Smoke.  Use vaginal douches, scented tampons, or scented sanitary pads.  Wear tight-fitting pants.  Wear thong underwear.  Use oral birth control pills or an IUD.  Have sex without a condom.  Have multiple sex partners.  Have an STD.  Frequently use the spermicide nonoxynol-9.  Eat lots of foods high in sugar.  Have uncontrolled diabetes.  Have low estrogen levels.  Have a weakened immune system from an immune disorder or medical  treatment.  Are pregnant or breastfeeding.  What are the signs or symptoms? Symptoms vary depending on the cause of the vaginitis. Common symptoms include:  Abnormal vaginal discharge. ? The discharge is white, gray, or yellow with bacterial vaginosis. ? The discharge is thick, white, and cheesy with a yeast infection. ? The discharge is frothy and yellow or greenish with trichomoniasis.  A bad vaginal smell. The smell is fishy with bacterial vaginosis.  Vaginal itching, pain, or swelling.  Sex that is painful.  Pain or burning when urinating.  Sometimes there are no symptoms. How is this diagnosed? This condition is diagnosed based on your symptoms and medical history. A physical exam, including a pelvic exam, will also be done. You may also have other tests, including:  Tests to determine the pH level (acidity or alkalinity) of your vagina.  A whiff test, to assess the odor that results when a sample of your vaginal discharge is mixed with a potassium hydroxide solution.  Tests of vaginal fluid. A sample will be examined under a microscope.  How is this treated? Treatment varies depending on the type of vaginitis you have. Your treatment may include:  Antibiotic creams or pills to treat bacterial vaginosis and trichomoniasis.  Antifungal medicines, such as vaginal creams or suppositories, to treat a yeast infection.  Medicine to ease discomfort if you have viral vaginitis. Your sexual partner should also be treated.  Estrogen delivered in a cream, pill, suppository, or vaginal ring to treat atrophic vaginitis. If vaginal dryness occurs, lubricants and moisturizing creams may help. You may need to avoid scented soaps, sprays, or douches.  Stopping use of a product that is causing allergic vaginitis. Then using a vaginal  cream to treat the symptoms.  Follow these instructions at home: Lifestyle  Keep your genital area clean and dry. Avoid soap, and only rinse the area  with water.  Do not douche or use tampons until your health care provider says it is okay to do so. Use sanitary pads, if needed.  Do not have sex until your health care provider approves. When you can return to sex, practice safe sex and use condoms.  Wipe from front to back. This avoids the spread of bacteria from the rectum to the vagina. General instructions  Take over-the-counter and prescription medicines only as told by your health care provider.  If you were prescribed an antibiotic medicine, take or use it as told by your health care provider. Do not stop taking or using the antibiotic even if you start to feel better.  Keep all follow-up visits as told by your health care provider. This is important. How is this prevented?  Use mild, non-scented products. Do not use things that can irritate the vagina, such as fabric softeners. Avoid the following products if they are scented: ? Feminine sprays. ? Detergents. ? Tampons. ? Feminine hygiene products. ? Soaps or bubble baths.  Let air reach your genital area. ? Wear cotton underwear to reduce moisture buildup. ? Avoid wearing underwear while you sleep. ? Avoid wearing tight pants and underwear or nylons without a cotton panel. ? Avoid wearing thong underwear.  Take off any wet clothing, such as bathing suits, as soon as possible.  Practice safe sex and use condoms. Contact a health care provider if:  You have abdominal pain.  You have a fever.  You have symptoms that last for more than 2-3 days. Get help right away if:  You have a fever and your symptoms suddenly get worse. Summary  Vaginitis is a condition in which the vaginal tissue becomes inflamed.This condition is most often caused by a change in the normal balance of bacteria and yeast that live in the vagina.  Treatment varies depending on the type of vaginitis you have.  Do not douche, use tampons , or have sex until your health care provider approves.  When you can return to sex, practice safe sex and use condoms. This information is not intended to replace advice given to you by your health care provider. Make sure you discuss any questions you have with your health care provider. Document Released: 02/12/2007 Document Revised: 05/23/2016 Document Reviewed: 05/23/2016 Elsevier Interactive Patient Education  2018 [employer]. Urinary Tract Infection, Adult A urinary tract infection (UTI) is an infection of any part of the urinary tract, which includes the kidneys, ureters, bladder, and urethra. These organs make, store, and get rid of urine in the body. UTI can be a bladder infection (cystitis) or kidney infection (pyelonephritis). What are the causes? This infection may be caused by fungi, viruses, or bacteria. Bacteria are the most common cause of UTIs. This condition can also be caused by repeated incomplete emptying of the bladder during urination. What increases the risk? This condition is more likely to develop if:  You ignore your need to urinate or hold urine for long periods of time.  You do not empty your bladder completely during urination.  You wipe back to front after urinating or having a bowel movement, if you are female.  You are uncircumcised, if you are female.  You are constipated.  You have a urinary catheter that stays in place (indwelling).  You have a weak defense (  immune) system.  You have a medical condition that affects your bowels, kidneys, or bladder.  You have diabetes.  You take antibiotic medicines frequently or for long periods of time, and the antibiotics no longer work well against certain types of infections (antibiotic resistance).  You take medicines that irritate your urinary tract.  You are exposed to chemicals that irritate your urinary tract.  You are female.  What are the signs or symptoms? Symptoms of this condition include:  Fever.  Frequent urination or passing small amounts of  urine frequently.  Needing to urinate urgently.  Pain or burning with urination.  Urine that smells bad or unusual.  Cloudy urine.  Pain in the lower abdomen or back.  Trouble urinating.  Blood in the urine.  Vomiting or being less hungry than normal.  Diarrhea or abdominal pain.  Vaginal discharge, if you are female.  How is this diagnosed? This condition is diagnosed with a medical history and physical exam. You will also need to provide a urine sample to test your urine. Other tests may be done, including:  Blood tests.  Sexually transmitted disease (STD) testing.  If you have had more than one UTI, a cystoscopy or imaging studies may be done to determine the cause of the infections. How is this treated? Treatment for this condition often includes a combination of two or more of the following:  Antibiotic medicine.  Other medicines to treat less common causes of UTI.  Over-the-counter medicines to treat pain.  Drinking enough water to stay hydrated.  Follow these instructions at home:  Take over-the-counter and prescription medicines only as told by your health care provider.  If you were prescribed an antibiotic, take it as told by your health care provider. Do not stop taking the antibiotic even if you start to feel better.  Avoid alcohol, caffeine, tea, and carbonated beverages. They can irritate your bladder.  Drink enough fluid to keep your urine clear or pale yellow.  Keep all follow-up visits as told by your health care provider. This is important.  Make sure to: ? Empty your bladder often and completely. Do not hold urine for long periods of time. ? Empty your bladder before and after sex. ? Wipe from front to back after a bowel movement if you are female. Use each tissue one time when you wipe. Contact a health care provider if:  You have back pain.  You have a fever.  You feel nauseous or vomit.  Your symptoms do not get better after 3  days.  Your symptoms go away and then return. Get help right away if:  You have severe back pain or lower abdominal pain.  You are vomiting and cannot keep down any medicines or water. This information is not intended to replace advice given to you by your health care provider. Make sure you discuss any questions you have with your health care provider. Document Released: 01/25/2005 Document Revised: 09/29/2015 Document Reviewed: 03/08/2015 Elsevier Interactive Patient Education  2017 [employer].

## 2016-11-13 NOTE — Addendum Note (Signed)
Addended by: Nicholes Rough on: 11/13/2016 04:32 PM   Modules accepted: Orders

## 2016-11-13 NOTE — Telephone Encounter (Signed)
Patient said she is returning a call to a nurse. No open phone note.

## 2016-11-13 NOTE — Telephone Encounter (Signed)
Spoke with patient. Advised rx for Flagyl 500 mg po BID x 7 days #14 0RF, Betamethasone valerate ointment 0.1 % apply 1 application topically 2 times daily, phenazopyridine 200 mg 3 times daily as needed for pain #6, and Bactrim DS bid x 3 days #6 have been sent to [facility] off [address]. Patient is agreeable.  Routing to provider for final review. Patient agreeable to disposition. Will close encounter.

## 2016-11-13 NOTE — Telephone Encounter (Signed)
Spoke with patient. Patient request prescription for Flagyl as discussed on 11/10/16. Patient reports new symptoms of vaginal itching started on 7/14. Reports internal burning when voiding and lower back pain that started over the weekend. Reports urinary frequency, states this is not new for her. Denies fever, chills, nausea/vomiting. LMP 7/12, previous cycle 10/29/16, IUD removed on 7/12. Recommended further evaluation of urinary symptoms, no RX provided at this time. Patient scheduled for OV today at 2:15pm with Dr. Talbert Nan. Patient verbalizes understanding and is agreeable.    Routing to provider for final review. Patient is agreeable to disposition. Will close encounter.      Notes recorded by Burnice Logan, RN on 11/10/2016 at 4:56 PM EDT Spoke with patient, advised as seen below per Dr. Talbert Nan. Patient denies vaginal odor, discomfort or discharge. No prescription needed at this time. Patient verbalizes understanding and is agreeable. ------  Notes recorded by Salvadore Dom, MD on 11/10/2016 at 4:43 PM EDT Please inform the patient that her vaginitis probe was + for BV and if symptomatic, treat with flagyl (either oral or vaginal, her choice), no ETOH while on Flagyl.  Oral: Flagyl 500 mg BID x 7 days, or Vaginal: Metrogel, 1 applicator per vagina q day x 5 days. She was having a test of cure for trich and that was negative. I would treat the BV if symptomatic.

## 2016-11-13 NOTE — Progress Notes (Signed)
GYNECOLOGY  VISIT   HPI: 26 y.o.   Single  African American  female   [id number] with Patient's last menstrual period was 11/11/2016.   here c/o dysuria and vaginal itching X 2 days   The patient was diagnosed with BV on vaginitis probe last week, negative TOC for yeast. She c/o dysuria x 2 days, no change in urinary frequency, some urinary urgency in the last 2 days. She is voiding small to normal amounts. She also c/o vulvar itching starting Saturday, the itching is mild, more on the left vulva. She denies an odor, no abnormal d/c.   GYNECOLOGIC HISTORY: Patient's last menstrual period was 11/11/2016. Contraception:none  Menopausal hormone therapy: none         OB History    Gravida Para Term Preterm AB Living   2       2     SAB TAB Ectopic Multiple Live Births                     Patient Active Problem List   Diagnosis Date Noted  . Class 3 obesity with body mass index (BMI) of 45.0 to 49.9 in adult [facility]) 09/01/2016    Past Medical History:  Diagnosis Date  . Anemia   . Blood in stool   . Chicken pox   . GERD (gastroesophageal reflux disease)   . STD (sexually transmitted disease) 08/2016   Trich   . Urinary tract infection     Past Surgical History:  Procedure Laterality Date  . COLPOSCOPY    . gallstones      Current Outpatient Prescriptions  Medication Sig Dispense Refill  . folic acid (FOLVITE) 0.5 MG tablet Take 0.5 mg by mouth daily.    . misoprostol (CYTOTEC) 200 MCG tablet Place 2 tablets vaginally 6-12 hours prior to IUD insertion 2 tablet 0   No current facility-administered medications for this visit.      ALLERGIES: Patient has no known allergies.  Family History  Problem Relation Age of Onset  . Arthritis Mother   . Hypertension Mother   . Diabetes Mother   . Arthritis Father   . Heart disease Father   . Stroke Father   . Hypertension Father   . Diabetes Father   . Hypertension Maternal Grandmother   . Stroke Maternal Grandmother   .  Heart disease Maternal Grandmother   . Hypertension Maternal Grandfather   . Stroke Maternal Grandfather   . Heart disease Maternal Grandfather   . Hypertension Paternal Grandmother   . Stroke Paternal Grandmother   . Heart disease Paternal Grandmother   . Hypertension Paternal Grandfather   . Stroke Paternal Grandfather   . Heart disease Paternal Grandfather     Social History   Social History  . Marital status: Single    Spouse name: N/A  . Number of children: N/A  . Years of education: N/A   Occupational History  . Not on file.   Social History Main Topics  . Smoking status: Never Smoker  . Smokeless tobacco: Never Used  . Alcohol use 0.6 oz/week    1 Standard drinks or equivalent per week  . Drug use: No  . Sexual activity: Yes    Partners: Male    Birth control/ protection: IUD     Comment: MIrena    Other Topics Concern  . Not on file   Social History Narrative  . No narrative on file    Review of Systems  Constitutional:  Negative.   HENT: Negative.   Eyes: Negative.   Respiratory: Negative.   Cardiovascular: Negative.   Gastrointestinal: Negative.   Genitourinary: Positive for dysuria.       Vaginal itching  Unscheduled bleeding  Musculoskeletal: Negative.   Skin: Negative.   Neurological: Negative.   Endo/Heme/Allergies:       Craving sweets/ excessive thrist  Psychiatric/Behavioral: Negative.     PHYSICAL EXAMINATION:    BP 124/80 (BP Location: Right Arm, Patient Position: Sitting, Cuff Size: Large)   Pulse 92   Resp 15   Wt (!) 343 lb (155.6 kg)   LMP 11/11/2016   BMI 50.29 kg/m     General appearance: alert, cooperative and appears stated age Abdomen: soft, non-tender; bowel sounds normal; uterus palpated in the SP region, not tender,  no organomegaly CVA: not tender  Pelvic: External genitalia:  no lesions, area of excoriation on the lower left labia majora.               Urethra:  normal appearing urethra with no masses, tenderness  or lesions              Bartholins and Skenes: normal                 Vagina: normal appearing vagina with normal color and discharge, no lesions              Cervix: no cervical motion tenderness and no lesions              Bimanual Exam:  Uterus:  anteverted, mobile, enlarged, not tender              Adnexa: no mass, fullness, tenderness               Chaperone was present for exam.  Urine dip: moderate leuk, +++ blood (on cycle)  Wet prep: + clue, no trich, no wbc KOH: no yeast PH: 5.5 but on menses   ASSESSMENT Symptoms of UTI BV No yeast seen She does have focal vulvitis Contraception Fibroid uterus    PLAN Treat BV Treat for presumed UTI Pyridium for discomfort Vaginitis panel Steroid ointment for local vulvitis Return next week for u/s guided Mirena IUD insertion. She has not been sexually active for over 3 weeks, will abstain until new IUD placed   An After Visit Summary was printed and given to the patient.  25 minutes face to face time of which over 50% was spent in counseling.

## 2016-11-14 LAB — URINE CULTURE: Organism ID, Bacteria: NO GROWTH

## 2016-11-14 LAB — URINALYSIS, MICROSCOPIC ONLY: CASTS: NONE SEEN /LPF

## 2016-11-14 LAB — VAGINITIS/VAGINOSIS, DNA PROBE
Candida Species: NEGATIVE
Gardnerella vaginalis: POSITIVE — AB
Trichomonas vaginosis: NEGATIVE

## 2016-11-15 ENCOUNTER — Telehealth: Payer: Self-pay | Admitting: Obstetrics and Gynecology

## 2016-11-15 NOTE — Telephone Encounter (Signed)
Spoke with patient regarding benefit for ultrasound guided Mirena IUD insertion. Patient understood and agreeable. Patient ready to schedule. Patient scheduled 11/21/16 with Dr Talbert Nan. Patient aware of date, arrival time and cancellation policy.   Also reminded patient, per Dr Gentry Fitz instructions, she will need to abstain from intercourse until new IUD is placed. Patient acknowledge understanding and had no further questions.   Routing to Dr Talbert Nan   cc: Starlyn Skeans

## 2016-11-21 ENCOUNTER — Other Ambulatory Visit: Payer: Self-pay | Admitting: Obstetrics and Gynecology

## 2016-11-21 ENCOUNTER — Ambulatory Visit (INDEPENDENT_AMBULATORY_CARE_PROVIDER_SITE_OTHER): Payer: 59 | Admitting: Obstetrics and Gynecology

## 2016-11-21 ENCOUNTER — Encounter: Payer: Self-pay | Admitting: Obstetrics and Gynecology

## 2016-11-21 ENCOUNTER — Ambulatory Visit (INDEPENDENT_AMBULATORY_CARE_PROVIDER_SITE_OTHER): Payer: 59

## 2016-11-21 VITALS — BP 120/80 | HR 72 | Resp 18 | Wt 344.0 lb

## 2016-11-21 DIAGNOSIS — N882 Stricture and stenosis of cervix uteri: Secondary | ICD-10-CM | POA: Diagnosis not present

## 2016-11-21 DIAGNOSIS — D259 Leiomyoma of uterus, unspecified: Secondary | ICD-10-CM

## 2016-11-21 DIAGNOSIS — Z308 Encounter for other contraceptive management: Secondary | ICD-10-CM

## 2016-11-21 DIAGNOSIS — Z30430 Encounter for insertion of intrauterine contraceptive device: Secondary | ICD-10-CM | POA: Diagnosis not present

## 2016-11-21 DIAGNOSIS — Z01812 Encounter for preprocedural laboratory examination: Secondary | ICD-10-CM | POA: Diagnosis not present

## 2016-11-21 LAB — POCT URINE PREGNANCY: Preg Test, Ur: NEGATIVE

## 2016-11-21 NOTE — Patient Instructions (Signed)

## 2016-11-21 NOTE — Progress Notes (Signed)
GYNECOLOGY  VISIT   HPI: 26 y.o.   Single  African American  female   [id number] with Patient's last menstrual period was 11/11/2016.   here for mirena IUD insertion with ultrasound guidance. The patient has a fibroid uterus and recently partially expulsed her last mirena. Her cycles were well controlled until the last few months    GYNECOLOGIC HISTORY: Patient's last menstrual period was 11/11/2016. Contraception: condoms Menopausal hormone therapy: NA        OB History    Gravida Para Term Preterm AB Living   2       2     SAB TAB Ectopic Multiple Live Births                     Patient Active Problem List   Diagnosis Date Noted  . Class 3 obesity with body mass index (BMI) of 45.0 to 49.9 in adult [facility]) 09/01/2016    Past Medical History:  Diagnosis Date  . Anemia   . Blood in stool   . Chicken pox   . GERD (gastroesophageal reflux disease)   . STD (sexually transmitted disease) 08/2016   Trich   . Urinary tract infection     Past Surgical History:  Procedure Laterality Date  . COLPOSCOPY    . gallstones      Current Outpatient Prescriptions  Medication Sig Dispense Refill  . betamethasone valerate ointment (VALISONE) 0.1 % Apply 1 application topically 2 (two) times daily. 15 g 0  . folic acid (FOLVITE) 0.5 MG tablet Take 0.5 mg by mouth daily.    . metroNIDAZOLE (FLAGYL) 500 MG tablet Take 1 tablet (500 mg total) by mouth 2 (two) times daily. 14 tablet 0  . misoprostol (CYTOTEC) 200 MCG tablet Place 2 tablets vaginally 6-12 hours prior to IUD insertion (Patient not taking: Reported on 11/13/2016) 2 tablet 0  . phenazopyridine (PYRIDIUM) 200 MG tablet Take 1 tablet (200 mg total) by mouth 3 (three) times daily as needed for pain. 6 tablet 0  . sulfamethoxazole-trimethoprim (BACTRIM DS) 800-160 MG tablet Take 1 tablet by mouth 2 (two) times daily. One PO BID x 3 days 6 tablet 0   No current facility-administered medications for this visit.      ALLERGIES: Patient  has no known allergies.  Family History  Problem Relation Age of Onset  . Arthritis Mother   . Hypertension Mother   . Diabetes Mother   . Arthritis Father   . Heart disease Father   . Stroke Father   . Hypertension Father   . Diabetes Father   . Hypertension Maternal Grandmother   . Stroke Maternal Grandmother   . Heart disease Maternal Grandmother   . Hypertension Maternal Grandfather   . Stroke Maternal Grandfather   . Heart disease Maternal Grandfather   . Hypertension Paternal Grandmother   . Stroke Paternal Grandmother   . Heart disease Paternal Grandmother   . Hypertension Paternal Grandfather   . Stroke Paternal Grandfather   . Heart disease Paternal Grandfather     Social History   Social History  . Marital status: Single    Spouse name: N/A  . Number of children: N/A  . Years of education: N/A   Occupational History  . Not on file.   Social History Main Topics  . Smoking status: Never Smoker  . Smokeless tobacco: Never Used  . Alcohol use 0.6 oz/week    1 Standard drinks or equivalent per week  . Drug  use: No  . Sexual activity: Yes    Partners: Male    Birth control/ protection: IUD     Comment: MIrena    Other Topics Concern  . Not on file   Social History Narrative  . No narrative on file    ROS  PHYSICAL EXAMINATION:    LMP 11/11/2016     General appearance: alert, cooperative and appears stated age  Pelvic: External genitalia:  no lesions              Urethra:  normal appearing urethra with no masses, tenderness or lesions              Bartholins and Skenes: normal                 Vagina: normal appearing vagina with normal color and discharge, no lesions              Cervix: no lesions  Chaperone was present for exam.  The risks of the mirena IUD were reviewed with the patient, including infection, abnormal bleeding and uterine perfortion. Consent was signed.  A speculum was placed in the vagina, the cervix was cleansed with  betadine. A tenaculum was placed on the cervix. Under ultrasound guidance the cervix was dilated with the mini-dilators to the #5 hagar dilator the uterus sounded to 11 cm. The cavity deviates significantly to the left.    The mirena IUD was inserted with abdominal ultrasound guidance. The string were cut to 3-4 cm. The tenaculum was removed. Slight oozing from the tenaculum site was stopped with pressure.  TV ultrasound also confirmed the IUD was in the uterine cavity.     ASSESSMENT Contraception, mirena IUD placed with ultrasound guidance Fibroid uterus, until the last few months cycles were normal with the IUD, In the last few months her cycles were long and heavy, but she had partially expulsed the IUD. She has had a normal CBC and ferritin     PLAN F/U in one month Recommended she use condoms given the size of her uterine cavity, the contraceptive efficacy could be lessoned.  She understands   An After Visit Summary was printed and given to the patient.  CC: Dr Betty Martinique

## 2016-12-18 ENCOUNTER — Ambulatory Visit: Payer: 59 | Admitting: Obstetrics and Gynecology

## 2016-12-25 ENCOUNTER — Telehealth: Payer: Self-pay | Admitting: Obstetrics and Gynecology

## 2016-12-25 NOTE — Telephone Encounter (Signed)
Patient called and cancelled her 4 week IUD recheck appointment for 12/26/16 with Dr. Talbert Nan. She said she is doing well and will call back to reschedule at a later date.  Routing to provider for FYI. Will keep encounter open to be sure patient reschedules.

## 2016-12-26 ENCOUNTER — Ambulatory Visit: Payer: 59 | Admitting: Obstetrics and Gynecology

## 2017-01-08 NOTE — Telephone Encounter (Signed)
Called and left patient a message to return call and reschedule the cancelled appointment.

## 2017-01-10 NOTE — Telephone Encounter (Signed)
Left patient a message to call back and reschedule.

## 2017-01-15 NOTE — Telephone Encounter (Signed)
Patient has not returned calls to reschedule the cancelled appointment. Routing to Dr. Talbert Nan for review. Okay to close encounter or is further follow up needed?

## 2017-01-16 NOTE — Telephone Encounter (Signed)
Will close the encounter  

## 2017-02-28 ENCOUNTER — Ambulatory Visit (INDEPENDENT_AMBULATORY_CARE_PROVIDER_SITE_OTHER): Payer: 59 | Admitting: Obstetrics and Gynecology

## 2017-02-28 ENCOUNTER — Encounter: Payer: Self-pay | Admitting: Obstetrics and Gynecology

## 2017-02-28 VITALS — BP 130/80 | HR 72 | Resp 16 | Wt 340.0 lb

## 2017-02-28 DIAGNOSIS — Z30431 Encounter for routine checking of intrauterine contraceptive device: Secondary | ICD-10-CM

## 2017-02-28 NOTE — Progress Notes (Signed)
GYNECOLOGY  VISIT   HPI: 26 y.o.   Single  African American  female   [id number] with Patient's last menstrual period was 02/11/2017.   here for IUD follow up. Patients partner is c/o being able to feel the IUD. Patient is requesting strings be cut shorter.  The patient had her IUD placed under ultrasound guidance in 7/18. She has a fibroid uterus and partially expulsed her prior IUD. She has been having monthly cycles for up to 2 weeks. She is saturating a super tampon in 4 hours for up to a week, then spotting. Cramps are better.   GYNECOLOGIC HISTORY: Patient's last menstrual period was 02/11/2017. Contraception:IUD  Menopausal hormone therapy: none         OB History    Gravida Para Term Preterm AB Living   2       2     SAB TAB Ectopic Multiple Live Births                     Patient Active Problem List   Diagnosis Date Noted  . Class 3 obesity with body mass index (BMI) of 45.0 to 49.9 in adult 09/01/2016    Past Medical History:  Diagnosis Date  . Anemia   . Blood in stool   . Chicken pox   . GERD (gastroesophageal reflux disease)   . STD (sexually transmitted disease) 08/2016   Trich   . Urinary tract infection     Past Surgical History:  Procedure Laterality Date  . COLPOSCOPY    . gallstones    . INTRAUTERINE DEVICE (IUD) INSERTION  10/2016   Mirena     Current Outpatient Prescriptions  Medication Sig Dispense Refill  . betamethasone valerate ointment (VALISONE) 0.1 % Apply 1 application topically 2 (two) times daily. 15 g 0  . folic acid (FOLVITE) 0.5 MG tablet Take 0.5 mg by mouth daily.    Marland Kitchen levonorgestrel (MIRENA) 20 MCG/24HR IUD 1 each by Intrauterine route once.     No current facility-administered medications for this visit.      ALLERGIES: Patient has no known allergies.  Family History  Problem Relation Age of Onset  . Arthritis Mother   . Hypertension Mother   . Diabetes Mother   . Arthritis Father   . Heart disease Father   . Stroke  Father   . Hypertension Father   . Diabetes Father   . Hypertension Maternal Grandmother   . Stroke Maternal Grandmother   . Heart disease Maternal Grandmother   . Hypertension Maternal Grandfather   . Stroke Maternal Grandfather   . Heart disease Maternal Grandfather   . Hypertension Paternal Grandmother   . Stroke Paternal Grandmother   . Heart disease Paternal Grandmother   . Hypertension Paternal Grandfather   . Stroke Paternal Grandfather   . Heart disease Paternal Grandfather     Social History   Social History  . Marital status: Single    Spouse name: N/A  . Number of children: N/A  . Years of education: N/A   Occupational History  . Not on file.   Social History Main Topics  . Smoking status: Never Smoker  . Smokeless tobacco: Never Used  . Alcohol use 0.6 oz/week    1 Standard drinks or equivalent per week  . Drug use: No  . Sexual activity: Yes    Partners: Male    Birth control/ protection: IUD     Comment: MIrena    Other Topics  Concern  . Not on file   Social History Narrative  . No narrative on file    Review of Systems  Constitutional: Negative.   HENT: Negative.   Eyes: Negative.   Respiratory: Negative.   Cardiovascular: Negative.   Gastrointestinal: Negative.   Genitourinary: Negative.   Musculoskeletal: Negative.   Skin: Negative.   Neurological: Negative.   Endo/Heme/Allergies: Negative.   Psychiatric/Behavioral: Negative.     PHYSICAL EXAMINATION:    BP 130/80 (BP Location: Right Arm, Patient Position: Sitting, Cuff Size: Large)   Pulse 72   Resp 16   Wt (!) 340 lb (154.2 kg)   LMP 02/11/2017   BMI 49.85 kg/m     General appearance: alert, cooperative and appears stated age  Pelvic: External genitalia:  no lesions              Urethra:  normal appearing urethra with no masses, tenderness or lesions              Bartholins and Skenes: normal                 Vagina: normal appearing vagina with normal color and discharge,  no lesions              Cervix: no cervical motion tenderness, no lesions and IUD strings 3 cm, lying across her cervix              Bimanual Exam:  Uterus:  anteverted, enlarged, irregularly shaped              Adnexa: no mass, fullness, tenderness                Chaperone was present for exam.  ASSESSMENT IUD check, she has had cycle improvement Partner getting irritation on his penis since the IUD was inserted (looks like welts after sex)    PLAN Recommended not cutting the string, I'm afraid if it's shorter it will poke her partner Discussed the option of cutting the strings at her cervix, but that would make it harder to remove Discussed using lubrication with sex (last time she was dry) and using condoms (reviewed that with her uterine length she should be using condoms anyway) If still having problems she will return   An After Visit Summary was printed and given to the patient.  ~12 minutes face to face time of which over 50% was spent in counseling.
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
## 2017-09-06 NOTE — Progress Notes (Signed)
HPI:   Linda Ochoa is a 27 y.o. female, who is here today for her routine physical.  Last CPE: 2016 She follows with gyn, Dr Caroll Rancher.  Regular exercise 3 or more time per week: 3 weeks ago she started running 3 times per week. Following a healthy diet: Not consistently, she has decreased fried food. She lives with her boyfriend.  Chronic medical problems: GERD and knee pain.    Immunization History  Administered Date(s) Administered  . Tdap 10/01/2015, 09/01/2016     She has some concerns today.  Left ear fullness sensation, she thinks it may be wax and would like ear lavage. She was treated for a sinus infection last week. According to pt, she was told abx will also help with this problem. No earache or drainage. No chills,fever,sore throat,or hearing loss.  Vaginal discharge: Noted after starting abx. No pruritus, it is whitish. No vaginal bleeding. No urinary symptoms.  Chlamydia about 10 years ago and trichomonas. One sex partner.  She would like STD testing done.  LMP: She has Mirena IUD.   Review of Systems  Constitutional: Negative for appetite change, fatigue and fever.  HENT: Negative for dental problem, ear discharge, ear pain, hearing loss, mouth sores, trouble swallowing and voice change.   Eyes: Negative for redness and visual disturbance.  Respiratory: Negative for cough, shortness of breath and wheezing.   Cardiovascular: Negative for chest pain and leg swelling.  Gastrointestinal: Negative for abdominal pain, nausea and vomiting.       No changes in bowel habits.  Endocrine: Negative for cold intolerance, heat intolerance, polydipsia, polyphagia and polyuria.  Genitourinary: Positive for vaginal discharge. Negative for decreased urine volume, dysuria, hematuria, pelvic pain and vaginal bleeding.  Musculoskeletal: Positive for arthralgias (knees). Negative for back pain and neck pain.  Skin: Negative for color change and  rash.  Allergic/Immunologic: Positive for environmental allergies.  Neurological: Negative for syncope, weakness and headaches.  Hematological: Negative for adenopathy. Does not bruise/bleed easily.  Psychiatric/Behavioral: Negative for confusion and sleep disturbance. The patient is not nervous/anxious.   All other systems reviewed and are negative.     Current Outpatient Medications on File Prior to Visit  Medication Sig Dispense Refill  . folic acid (FOLVITE) 0.5 MG tablet Take 0.5 mg by mouth daily.    Marland Kitchen levonorgestrel (MIRENA) 20 MCG/24HR IUD 1 each by Intrauterine route once.    . betamethasone valerate ointment (VALISONE) 0.1 % Apply 1 application topically 2 (two) times daily. (Patient not taking: Reported on 09/07/2017) 15 g 0   No current facility-administered medications on file prior to visit.      Past Medical History:  Diagnosis Date  . Anemia   . Blood in stool   . Chicken pox   . GERD (gastroesophageal reflux disease)   . STD (sexually transmitted disease) 08/2016   Trich   . Urinary tract infection     Past Surgical History:  Procedure Laterality Date  . COLPOSCOPY    . gallstones    . INTRAUTERINE DEVICE (IUD) INSERTION  10/2016   Mirena     No Known Allergies  Family History  Problem Relation Age of Onset  . Arthritis Mother   . Hypertension Mother   . Diabetes Mother   . Arthritis Father   . Heart disease Father   . Stroke Father   . Hypertension Father   . Diabetes Father   . Hypertension Maternal Grandmother   . Stroke Maternal Grandmother   .  Heart disease Maternal Grandmother   . Hypertension Maternal Grandfather   . Stroke Maternal Grandfather   . Heart disease Maternal Grandfather   . Hypertension Paternal Grandmother   . Stroke Paternal Grandmother   . Heart disease Paternal Grandmother   . Hypertension Paternal Grandfather   . Stroke Paternal Grandfather   . Heart disease Paternal Grandfather     Social History    Socioeconomic History  . Marital status: Single    Spouse name: Not on file  . Number of children: Not on file  . Years of education: Not on file  . Highest education level: Not on file  Occupational History  . Not on file  Social Needs  . Financial resource strain: Not on file  . Food insecurity:    Worry: Not on file    Inability: Not on file  . Transportation needs:    Medical: Not on file    Non-medical: Not on file  Tobacco Use  . Smoking status: Never Smoker  . Smokeless tobacco: Never Used  Substance and Sexual Activity  . Alcohol use: Yes    Alcohol/week: 0.6 oz    Types: 1 Standard drinks or equivalent per week  . Drug use: No  . Sexual activity: Yes    Partners: Male    Birth control/protection: IUD    Comment: MIrena   Lifestyle  . Physical activity:    Days per week: Not on file    Minutes per session: Not on file  . Stress: Not on file  Relationships  . Social connections:    Talks on phone: Not on file    Gets together: Not on file    Attends religious service: Not on file    Active member of club or organization: Not on file    Attends meetings of clubs or organizations: Not on file    Relationship status: Not on file  Other Topics Concern  . Not on file  Social History Narrative  . Not on file     Vitals:   09/07/17 0809  BP: 122/83  Pulse: 78  Resp: 12  Temp: 98.3 F (36.8 C)  SpO2: 98%   Body mass index is 50.89 kg/m.   Wt Readings from Last 3 Encounters:  09/07/17 (!) 347 lb 2 oz (157.5 kg)  02/28/17 (!) 340 lb (154.2 kg)  11/21/16 (!) 344 lb (156 kg)      Physical Exam  Nursing note and vitals reviewed. Constitutional: She is oriented to person, place, and time. She appears well-developed. No distress.  HENT:  Head: Normocephalic and atraumatic.  Right Ear: Hearing, tympanic membrane, external ear and ear canal normal.  Left Ear: Hearing, external ear and ear canal normal. Tympanic membrane is bulging (mild). A middle  ear effusion is present.  Mouth/Throat: Uvula is midline, oropharynx is clear and moist and mucous membranes are normal.  Hypertrophic turbinates.  Eyes: Pupils are equal, round, and reactive to light. Conjunctivae and EOM are normal.  Neck: No tracheal deviation present. No thyroid mass and no thyromegaly present.  Cardiovascular: Normal rate and regular rhythm.  No murmur heard. Pulses:      Dorsalis pedis pulses are 2+ on the right side, and 2+ on the left side.  Respiratory: Effort normal and breath sounds normal. No respiratory distress.  GI: Soft. She exhibits no mass. There is no hepatomegaly. There is no tenderness.  Genitourinary:  Genitourinary Comments: Deferred to gyn.  Musculoskeletal: She exhibits no edema.  No major  deformity or signs of synovitis appreciated.  Lymphadenopathy:    She has no cervical adenopathy.       Right: No supraclavicular adenopathy present.       Left: No supraclavicular adenopathy present.  Neurological: She is alert and oriented to person, place, and time. She has normal strength. No cranial nerve deficit. Coordination and gait normal.  Reflex Scores:      Bicep reflexes are 2+ on the right side and 2+ on the left side.      Patellar reflexes are 2+ on the right side and 2+ on the left side. Skin: Skin is warm. No rash noted. No erythema.  Psychiatric: She has a normal mood and affect.  Well groomed, good eye contact.      ASSESSMENT AND PLAN:  Ms. KYRI SHADER was here today annual physical examination.  Orders Placed This Encounter  Procedures  . HIV antibody  . Lipid panel  . Basic metabolic panel  . RPR    Lab Results  Component Value Date   CHOL 193 09/07/2017   HDL 44.20 09/07/2017   LDLCALC 134 (H) 09/07/2017   TRIG 74.0 09/07/2017   CHOLHDL 4 09/07/2017   Lab Results  Component Value Date   CREATININE 0.78 09/07/2017   BUN 12 09/07/2017   NA 138 09/07/2017   K 4.2 09/07/2017   CL 105 09/07/2017   CO2 26  09/07/2017    Routine general medical examination at a health care facility  We discussed the importance of regular physical activity and healthy diet for prevention of chronic illness and/or complications. Preventive guidelines reviewed. Vaccination up to date. STD prevention discussed. Next CPE in a year.  Diabetes mellitus screening -     Basic metabolic panel  Screening for lipid disorders -     Lipid panel  Screen for STD (sexually transmitted disease) -     HIV antibody -     Urine cytology ancillary only -     RPR  Eustachian tube dysfunction, left  OTC Decongestants and auto inflation maneuvers may help. Instructed about warning signs. F/U as needed.  Vaginal discharge  Possible etiologies discussed. No pruritus. ? Physiologic. Further recommendations will be given according to lab results.  Morbid obesity with BMI of 50.0-59.9, adult ([place])  We discussed benefits of wt loss as well as adverse effects of obesity. Consistency with healthy diet and physical activity recommended.    Return in 1 year (on 09/08/2018) for routine.          Agueda Houpt G. Martinique, MD  [facility]. [place] office.

## 2017-09-07 ENCOUNTER — Encounter: Payer: Self-pay | Admitting: Family Medicine

## 2017-09-07 ENCOUNTER — Ambulatory Visit (INDEPENDENT_AMBULATORY_CARE_PROVIDER_SITE_OTHER): Payer: BLUE CROSS/BLUE SHIELD | Admitting: Family Medicine

## 2017-09-07 ENCOUNTER — Other Ambulatory Visit (HOSPITAL_COMMUNITY)
Admission: RE | Admit: 2017-09-07 | Discharge: 2017-09-07 | Disposition: A | Discharge status: Home / Self Care | Payer: BLUE CROSS/BLUE SHIELD | Source: Ambulatory Visit | Attending: Family Medicine | Admitting: Family Medicine

## 2017-09-07 VITALS — BP 122/83 | HR 78 | Temp 98.3°F | Resp 12 | Ht 69.25 in | Wt 347.1 lb

## 2017-09-07 DIAGNOSIS — Z0000 Encounter for general adult medical examination without abnormal findings: Secondary | ICD-10-CM | POA: Diagnosis not present

## 2017-09-07 DIAGNOSIS — Z6841 Body Mass Index (BMI) 40.0 and over, adult: Secondary | ICD-10-CM | POA: Diagnosis not present

## 2017-09-07 DIAGNOSIS — Z113 Encounter for screening for infections with a predominantly sexual mode of transmission: Secondary | ICD-10-CM

## 2017-09-07 DIAGNOSIS — Z131 Encounter for screening for diabetes mellitus: Secondary | ICD-10-CM

## 2017-09-07 DIAGNOSIS — H6982 Other specified disorders of Eustachian tube, left ear: Secondary | ICD-10-CM

## 2017-09-07 DIAGNOSIS — H6992 Unspecified Eustachian tube disorder, left ear: Secondary | ICD-10-CM

## 2017-09-07 DIAGNOSIS — N898 Other specified noninflammatory disorders of vagina: Secondary | ICD-10-CM

## 2017-09-07 DIAGNOSIS — Z13220 Encounter for screening for lipoid disorders: Secondary | ICD-10-CM

## 2017-09-07 LAB — BASIC METABOLIC PANEL
BUN: 12 mg/dL (ref 6–23)
CO2: 26 mEq/L (ref 19–32)
Calcium: 9.3 mg/dL (ref 8.4–10.5)
Chloride: 105 mEq/L (ref 96–112)
Creatinine, Ser: 0.78 mg/dL (ref 0.40–1.20)
GFR: 112.16 mL/min (ref >60.00)
Glucose, Bld: 82 mg/dL (ref 70–99)
POTASSIUM: 4.2 meq/L (ref 3.5–5.1)
Sodium: 138 mEq/L (ref 135–145)

## 2017-09-07 LAB — LIPID PANEL
CHOL/HDL RATIO: 4
Cholesterol: 193 mg/dL (ref 0–200)
HDL: 44.2 mg/dL (ref >39.00)
LDL CALC: 134 mg/dL — AB (ref 0–99)
NONHDL: 148.66
TRIGLYCERIDES: 74 mg/dL (ref 0.0–149.0)
VLDL: 14.8 mg/dL (ref 0.0–40.0)

## 2017-09-07 NOTE — Patient Instructions (Addendum)
A few things to remember from today's visit:   Routine general medical examination at a health care facility  Diabetes mellitus screening - Plan: Basic metabolic panel  Screening for lipid disorders - Plan: Lipid panel  Screen for STD (sexually transmitted disease) - Plan: HIV antibody, Urine cytology ancillary only, RPR  Eustachian tube dysfunction, left   Today you have you routine preventive visit.  At least 150 minutes of moderate exercise per week, daily brisk walking for 15-30 min is a good exercise option. Healthy diet low in saturated (animal) fats and sweets and consisting of fresh fruits and vegetables, lean meats such as fish and white chicken and whole grains.  These are some of recommendations for screening depending of age and risk factors:   - Vaccines:  Tdap vaccine every 10 years.  Shingles vaccine recommended at age 77, could be given after 29 years of age but not sure about insurance coverage.   Pneumonia vaccines:  Prevnar 13 at 65 and Pneumovax at 53. Sometimes Pneumovax is giving earlier if history of smoking, lung disease,diabetes,kidney disease among some.    Screening for diabetes at age 6 and every 3 years.  Cervical cancer prevention:  Pap smear starts at 29 years of age and continues periodically until 29 years old in low risk women. Pap smear every 3 years between 74 and 63 years old. Pap smear every 3-5 years between women 79 and older if pap smear negative and HPV screening negative.   -Breast cancer: Mammogram: There is disagreement between experts about when to start screening in low risk asymptomatic female but recent recommendations are to start screening at 59 and not later than 29 years old , every 1-2 years and after 29 yo q 2 years. Screening is recommended until 29 years old but some women can continue screening depending of healthy issues.   Colon cancer screening: starts at 29 years old until 29 years old.  Cholesterol disorder  screening at age 84 and every 3 years.  Also recommended:  1. Dental visit- Brush and floss your teeth twice daily; visit your dentist twice a year. 2. Eye doctor- Get an eye exam at least every 2 years. 3. Helmet use- Always wear a helmet when riding a bicycle, motorcycle, rollerblading or skateboarding. 4. Safe sex- If you may be exposed to sexually transmitted infections, use a condom. 5. Seat belts- Seat belts can save your live; always wear one. 6. Smoke/Carbon Monoxide detectors- These detectors need to be installed on the appropriate level of your home. Replace batteries at least once a year. 7. Skin cancer- When out in the sun please cover up and use sunscreen 15 SPF or higher. 8. Violence- If anyone is threatening or hurting you, please tell your healthcare provider.  9. Drink alcohol in moderation- Limit alcohol intake to one drink or less per day. Never drink and drive.  Please be sure medication list is accurate. If a new problem present, please set up appointment sooner than planned today.

## 2017-09-10 LAB — HIV ANTIBODY (ROUTINE TESTING W REFLEX): HIV 1&2 Ab, 4th Generation: NONREACTIVE

## 2017-09-10 LAB — URINE CYTOLOGY ANCILLARY ONLY
CHLAMYDIA, DNA PROBE: NEGATIVE
NEISSERIA GONORRHEA: NEGATIVE
Trichomonas: POSITIVE — AB

## 2017-09-10 LAB — RPR: RPR: NONREACTIVE

## 2017-09-12 ENCOUNTER — Other Ambulatory Visit: Payer: Self-pay | Admitting: *Deleted

## 2017-09-12 MED ORDER — METRONIDAZOLE 500 MG PO TABS: 500.0000 mg | ORAL_TABLET | Freq: Two times a day (BID) | ORAL | 0 refills | Status: DC

## 2017-09-14 ENCOUNTER — Other Ambulatory Visit: Payer: Self-pay | Admitting: *Deleted

## 2017-09-14 MED ORDER — METRONIDAZOLE 500 MG PO TABS: 500.0000 mg | ORAL_TABLET | Freq: Two times a day (BID) | ORAL | 0 refills | Status: AC

## 2017-10-02 ENCOUNTER — Ambulatory Visit (INDEPENDENT_AMBULATORY_CARE_PROVIDER_SITE_OTHER): Payer: BLUE CROSS/BLUE SHIELD | Admitting: Family Medicine

## 2017-10-02 ENCOUNTER — Encounter: Payer: Self-pay | Admitting: Family Medicine

## 2017-10-02 ENCOUNTER — Other Ambulatory Visit (HOSPITAL_COMMUNITY)
Admission: RE | Admit: 2017-10-02 | Discharge: 2017-10-02 | Disposition: A | Discharge status: Home / Self Care | Payer: BLUE CROSS/BLUE SHIELD | Source: Ambulatory Visit | Attending: Family Medicine | Admitting: Family Medicine

## 2017-10-02 VITALS — BP 125/83 | HR 60 | Temp 97.7°F | Resp 12 | Ht 69.25 in | Wt 352.1 lb

## 2017-10-02 DIAGNOSIS — Z8619 Personal history of other infectious and parasitic diseases: Secondary | ICD-10-CM | POA: Diagnosis present

## 2017-10-02 NOTE — Progress Notes (Signed)
HPI:   [name] is a 27 y.o. female, who is here today to follow on recent OV.   She was seen on 09/07/2016, when she was diagnosed with trichomoniasis. She completed treatment with Metronidazole and so did her partner. She tolerated medication well, no side effects reported.  Negative for abdominal pain, vaginal discharge, abnormal vaginal bleeding, or pelvic pain.  She would like to have trach there is a urine to be sure infection has resolved.    Review of Systems  Constitutional: Negative for chills and fever.  HENT: Negative for mouth sores and sore throat.   Gastrointestinal: Negative for abdominal pain, nausea and vomiting.  Genitourinary: Negative for dysuria, vaginal bleeding and vaginal discharge.  Musculoskeletal: Negative for arthralgias and joint swelling.  Skin: Negative for rash and wound.      Current Outpatient Medications on File Prior to Visit  Medication Sig Dispense Refill  . betamethasone valerate ointment (VALISONE) 0.1 % Apply 1 application topically 2 (two) times daily. (Patient not taking: Reported on 3/79/0240) 15 g 0  . folic acid (FOLVITE) 0.5 MG tablet Take 0.5 mg by mouth daily.    Marland Kitchen levonorgestrel (MIRENA) 20 MCG/24HR IUD 1 each by Intrauterine route once.     No current facility-administered medications on file prior to visit.      Past Medical History:  Diagnosis Date  . Anemia   . Blood in stool   . Chicken pox   . GERD (gastroesophageal reflux disease)   . STD (sexually transmitted disease) 08/2016   Trich   . Urinary tract infection    No Known Allergies  Social History   Socioeconomic History  . Marital status: Single    Spouse name: Not on file  . Number of children: Not on file  . Years of education: Not on file  . Highest education level: Not on file  Occupational History  . Not on file  Social Needs  . Financial resource strain: Not on file  . Food insecurity:    Worry: Not on file    Inability:  Not on file  . Transportation needs:    Medical: Not on file    Non-medical: Not on file  Tobacco Use  . Smoking status: Never Smoker  . Smokeless tobacco: Never Used  Substance and Sexual Activity  . Alcohol use: Yes    Alcohol/week: 0.6 oz    Types: 1 Standard drinks or equivalent per week  . Drug use: No  . Sexual activity: Yes    Partners: Male    Birth control/protection: IUD    Comment: MIrena   Lifestyle  . Physical activity:    Days per week: Not on file    Minutes per session: Not on file  . Stress: Not on file  Relationships  . Social connections:    Talks on phone: Not on file    Gets together: Not on file    Attends religious service: Not on file    Active member of club or organization: Not on file    Attends meetings of clubs or organizations: Not on file    Relationship status: Not on file  Other Topics Concern  . Not on file  Social History Narrative  . Not on file    Vitals:   10/02/17 0927  BP: 125/83  Pulse: 60  Resp: 12  Temp: 97.7 F (36.5 C)  SpO2: 100%   Body mass index is 51.63 kg/m.  Physical Exam  Nursing note and vitals reviewed. Constitutional: She is oriented to person, place, and time. She appears well-developed. No distress.  HENT:  Head: Normocephalic and atraumatic.  Eyes: Conjunctivae are normal.  Cardiovascular: Normal rate and regular rhythm.  Respiratory: Effort normal. No respiratory distress.  GI: Soft. She exhibits no mass. There is no tenderness. There is no CVA tenderness.  Musculoskeletal: She exhibits no edema.  Neurological: She is alert and oriented to person, place, and time.  Skin: Skin is warm. No erythema.  Psychiatric: She has a normal mood and affect.  Well-groomed, good eye contact.    ASSESSMENT AND PLAN:  Ms. Linda Ochoa was seen today for follow-up.  Diagnoses and all orders for this visit:  History of trichomonal vaginitis -     Urine cytology ancillary only   STD prevention  discussed. Educated about Dx of vaginal trichomonas. Urine cytology ordered today, further recommendation will be given accordingly. Follow-up as needed.    Calla Wedekind G. Martinique, MD  [facility]. [place] office.

## 2017-10-02 NOTE — Patient Instructions (Signed)
A few things to remember from today's visit:   History of trichomonal vaginitis - Plan: Urine cytology ancillary only  Trichomonas Test Why am I having this test? The trichomonas test is done to diagnose trichomoniasis, an infection caused by an organism called Trichomonas. Trichomoniasis is a sexually transmitted infection (STI). In women, it causes vaginal infections. In men, it can cause the tube that carries urine (urethra) to become inflamed (urethritis). You may have this test as a part of a routine screening for STIs or if you have symptoms of trichomoniasis. To perform the test, your health care provider will take a sample of discharge. The sample is taken from the vagina or cervix in women and from the urethra in men. A urine sample can also be used for testing. What do the results mean? It is your responsibility to obtain your test results. Ask the lab or department performing the test when and how you will get your results. Contact your health care provider to discuss any questions you have about your results. Negative test results A negative test means you do not have trichomoniasis. Follow your health care provider's directions about any follow-up testing. Positive test results A positive test result means you have an active infection that needs to be treated with antibiotic medicine. All your current sexual partners must also be treated or it is likely you will get reinfected. If your test is positive, your health care provider will start you on medicine and may advise you to:  Not have sexual intercourse until your infection has cleared up.  Use a latex condom properly every time you have sexual intercourse.  Limit the number of sexual partners you have. The more partners you have, the greater your risk of contracting trichomoniasis or another STI.  Tell all sexual partners about your infection so they can also be treated and to prevent reinfection.  Talk with your health care  provider to discuss your results, treatment options, and if necessary, the need for more tests. Talk with your health care provider if you have any questions about your results. This information is not intended to replace advice given to you by your health care provider. Make sure you discuss any questions you have with your health care provider. Document Released: 05/20/2004 Document Revised: 11/17/2015 Document Reviewed: 04/29/2013 Elsevier Interactive Patient Education  2017 [place].  Please be sure medication list is accurate. If a new problem present, please set up appointment sooner than planned today.

## 2017-10-03 ENCOUNTER — Encounter: Payer: Self-pay | Admitting: Family Medicine

## 2017-10-03 LAB — URINE CYTOLOGY ANCILLARY ONLY: Trichomonas: NEGATIVE

## 2017-10-05 ENCOUNTER — Telehealth: Payer: Self-pay | Admitting: Family Medicine

## 2017-10-05 ENCOUNTER — Telehealth: Payer: Self-pay

## 2017-10-05 NOTE — Telephone Encounter (Signed)
Results given and documented in result note. 

## 2017-10-05 NOTE — Telephone Encounter (Signed)
It is ok with me. ?BJ ?

## 2017-10-05 NOTE — Telephone Encounter (Signed)
Copied from [place] [id number]. Topic: General - Other >> Oct 05, 2017  4:11 PM Dimple Nanas, RN wrote: Reason for CRM: Pt requesting to change PCP from Dr. Martinique to Dr. Volanda Napoleon   Dr. Martinique - Please advise if ok to transfer care to Dr. Volanda Napoleon. Thanks!

## 2017-10-05 NOTE — Telephone Encounter (Signed)
Copied from [place] [id number]. Topic: Quick Communication - Lab Results >> Oct 05, 2017  2:51 PM Dorrene German, RN wrote: Hulen Skains patient to inform them of lab results. When patient returns call, triage nurse may disclose results. She states it is ok to leave voicemail

## 2017-10-05 NOTE — Telephone Encounter (Signed)
Dr. Volanda Napoleon - Please advise if ok for pt to transfer care to you. Thanks!

## 2017-10-08 NOTE — Telephone Encounter (Signed)
Ok

## 2017-10-12 NOTE — Telephone Encounter (Signed)
Pt has been advised. She will call back to schedule TOC.

## 2017-12-28 ENCOUNTER — Telehealth: Payer: Self-pay | Admitting: Obstetrics and Gynecology

## 2017-12-28 NOTE — Telephone Encounter (Signed)
Patient called and requested an appointment for a possible UTI for next Tuesday, 01/01/18. She said she is not available today. Routing to triage for further assessment prior to scheduling.

## 2017-12-28 NOTE — Telephone Encounter (Signed)
Return call to patient. She complains of one week of vaginal odor, vaginal irritation and pain at the end of her void.  No fevers or pelvic pain. No nausea or vomiting.   Discussed with patient that if her symptoms increase she should seek care at urgent care or ER with symptoms. Discussed with patient that symptoms such as fevers, chills, lower back pain, urinary frequency, it is not advised to wait for treatment as infection can rapidly spread and cause worsening infection, and place patient at risk for kidney infection. Patient then states "I don't think this is an infection, I think there may be something else wrong." Would also like to plan for annual exam. Patient verbalizes understanding and states she will go to an urgent care if her symptoms worsen.   Scheduled annual exam with Dr. Talbert Nan 01/01/18 at 1000.   Routing to Dr. Talbert Nan and will close encounter.

## 2018-01-01 ENCOUNTER — Encounter: Payer: Self-pay | Admitting: Obstetrics and Gynecology

## 2018-01-01 ENCOUNTER — Ambulatory Visit (INDEPENDENT_AMBULATORY_CARE_PROVIDER_SITE_OTHER): Payer: BLUE CROSS/BLUE SHIELD | Admitting: Obstetrics and Gynecology

## 2018-01-01 ENCOUNTER — Other Ambulatory Visit: Payer: Self-pay

## 2018-01-01 VITALS — BP 146/88 | HR 86 | Resp 20 | Ht 70.5 in | Wt 359.0 lb

## 2018-01-01 DIAGNOSIS — Z3009 Encounter for other general counseling and advice on contraception: Secondary | ICD-10-CM | POA: Diagnosis not present

## 2018-01-01 DIAGNOSIS — Z30432 Encounter for removal of intrauterine contraceptive device: Secondary | ICD-10-CM

## 2018-01-01 DIAGNOSIS — N920 Excessive and frequent menstruation with regular cycle: Secondary | ICD-10-CM

## 2018-01-01 DIAGNOSIS — Z8619 Personal history of other infectious and parasitic diseases: Secondary | ICD-10-CM | POA: Diagnosis not present

## 2018-01-01 DIAGNOSIS — R3915 Urgency of urination: Secondary | ICD-10-CM

## 2018-01-01 DIAGNOSIS — D259 Leiomyoma of uterus, unspecified: Secondary | ICD-10-CM

## 2018-01-01 DIAGNOSIS — Z01419 Encounter for gynecological examination (general) (routine) without abnormal findings: Secondary | ICD-10-CM

## 2018-01-01 DIAGNOSIS — R030 Elevated blood-pressure reading, without diagnosis of hypertension: Secondary | ICD-10-CM

## 2018-01-01 LAB — POCT URINE PREGNANCY: Preg Test, Ur: NEGATIVE

## 2018-01-01 MED ORDER — NORETHINDRONE 0.35 MG PO TABS: 1.0000 | ORAL_TABLET | Freq: Every day | ORAL | 0 refills | Status: DC

## 2018-01-01 NOTE — Patient Instructions (Addendum)
EXERCISE AND DIET:  We recommended that you start or continue a regular exercise program for good health. Regular exercise means any activity that makes your heart beat faster and makes you sweat.  We recommend exercising at least 30 minutes per day at least 3 days a week, preferably 4 or 5.  We also recommend a diet low in fat and sugar.  Inactivity, poor dietary choices and obesity can cause diabetes, heart attack, stroke, and kidney damage, among others.    ALCOHOL AND SMOKING:  Women should limit their alcohol intake to no more than 7 drinks/beers/glasses of wine (combined, not each!) per week. Moderation of alcohol intake to this level decreases your risk of breast cancer and liver damage. And of course, no recreational drugs are part of a healthy lifestyle.  And absolutely no smoking or even second hand smoke. Most people know smoking can cause heart and lung diseases, but did you know it also contributes to weakening of your bones? Aging of your skin?  Yellowing of your teeth and nails?  CALCIUM AND VITAMIN D:  Adequate intake of calcium and Vitamin D are recommended.  The recommendations for exact amounts of these supplements seem to change often, but generally speaking 600 mg of calcium (either carbonate or citrate) and 800 units of Vitamin D per day seems prudent. Certain women may benefit from higher intake of Vitamin D.  If you are among these women, your doctor will have told you during your visit.    PAP SMEARS:  Pap smears, to check for cervical cancer or precancers,  have traditionally been done yearly, although recent scientific advances have shown that most women can have pap smears less often.  However, every woman still should have a physical exam from her gynecologist every year. It will include a breast check, inspection of the vulva and vagina to check for abnormal growths or skin changes, a visual exam of the cervix, and then an exam to evaluate the size and shape of the uterus and  ovaries.  And after 29 years of age, a rectal exam is indicated to check for rectal cancers. We will also provide age appropriate advice regarding health maintenance, like when you should have certain vaccines, screening for sexually transmitted diseases, bone density testing, colonoscopy, mammograms, etc.   MAMMOGRAMS:  All women over 40 years old should have a yearly mammogram. Many facilities now offer a "3D" mammogram, which may cost around $50 extra out of pocket. If possible,  we recommend you accept the option to have the 3D mammogram performed.  It both reduces the number of women who will be called back for extra views which then turn out to be normal, and it is better than the routine mammogram at detecting truly abnormal areas.    COLONOSCOPY:  Colonoscopy to screen for colon cancer is recommended for all women at age 50.  We know, you hate the idea of the prep.  We agree, BUT, having colon cancer and not knowing it is worse!!  Colon cancer so often starts as a polyp that can be seen and removed at colonscopy, which can quite literally save your life!  And if your first colonoscopy is normal and you have no family history of colon cancer, most women don't have to have it again for 10 years.  Once every ten years, you can do something that may end up saving your life, right?  We will be happy to help you get it scheduled when you are ready.    Be sure to check your insurance coverage so you understand how much it will cost.  It may be covered as a preventative service at no cost, but you should check your particular policy.      Oral Contraception Information Oral contraceptive pills (OCPs) are medicines taken to prevent pregnancy. OCPs work by preventing the ovaries from releasing eggs. The hormones in OCPs also cause the cervical mucus to thicken, preventing the sperm from entering the uterus. The hormones also cause the uterine lining to become thin, not allowing a fertilized egg to attach to the  inside of the uterus. OCPs are highly effective when taken exactly as prescribed. However, OCPs do not prevent sexually transmitted diseases (STDs). Safe sex practices, such as using condoms along with the pill, can help prevent STDs. Before taking the pill, you may have a physical exam and Pap test. Your health care provider may order blood tests. The health care provider will make sure you are a good candidate for oral contraception. Discuss with your health care provider the possible side effects of the OCP you may be prescribed. When starting an OCP, it can take 2 to 3 months for the body to adjust to the changes in hormone levels in your body. Types of oral contraception  The combination pill-This pill contains estrogen and progestin (synthetic progesterone) hormones. The combination pill comes in 21-day, 28-day, or 91-day packs. Some types of combination pills are meant to be taken continuously (365-day pills). With 21-day packs, you do not take pills for 7 days after the last pill. With 28-day packs, the pill is taken every day. The last 7 pills are without hormones. Certain types of pills have more than 21 hormone-containing pills. With 91-day packs, the first 84 pills contain both hormones, and the last 7 pills contain no hormones or contain estrogen only.  The minipill-This pill contains the progesterone hormone only. The pill is taken every day continuously. It is very important to take the pill at the same time each day. The minipill comes in packs of 28 pills. All 28 pills contain the hormone. Advantages of oral contraceptive pills  Decreases premenstrual symptoms.  Treats menstrual period cramps.  Regulates the menstrual cycle.  Decreases a heavy menstrual flow.  May treatacne, depending on the type of pill.  Treats abnormal uterine bleeding.  Treats polycystic ovarian syndrome.  Treats endometriosis.  Can be used as emergency contraception. Things that can make oral  contraceptive pills less effective OCPs can be less effective if:  You forget to take the pill at the same time every day.  You have a stomach or intestinal disease that lessens the absorption of the pill.  You take OCPs with other medicines that make OCPs less effective, such as antibiotics, certain HIV medicines, and some seizure medicines.  You take expired OCPs.  You forget to restart the pill on day 7, when using the packs of 21 pills.  Risks associated with oral contraceptive pills Oral contraceptive pills can sometimes cause side effects, such as:  Headache.  Nausea.  Breast tenderness.  Irregular bleeding or spotting.  Combination pills are also associated with a small increased risk of:  Blood clots.  Heart attack.  Stroke.  This information is not intended to replace advice given to you by your health care provider. Make sure you discuss any questions you have with your health care provider. Document Released: 07/08/2002 Document Revised: 09/23/2015 Document Reviewed: 10/06/2012 Elsevier Interactive Patient Education  2018 [place]  Self-Awareness Breast self-awareness means being familiar with how your breasts look and feel. It involves checking your breasts regularly and reporting any changes to your health care provider. Practicing breast self-awareness is important. A change in your breasts can be a sign of a serious medical problem. Being familiar with how your breasts look and feel allows you to find any problems early, when treatment is more likely to be successful. All women should practice breast self-awareness, including women who have had breast implants. How to do a breast self-exam One way to learn what is normal for your breasts and whether your breasts are changing is to do a breast self-exam. To do a breast self-exam: Look for Changes  1. Remove all the clothing above your waist. 2. Stand in front of a mirror in a room with good  lighting. 3. Put your hands on your hips. 4. Push your hands firmly downward. 5. Compare your breasts in the mirror. Look for differences between them (asymmetry), such as: ? Differences in shape. ? Differences in size. ? Puckers, dips, and bumps in one breast and not the other. 6. Look at each breast for changes in your skin, such as: ? Redness. ? Scaly areas. 7. Look for changes in your nipples, such as: ? Discharge. ? Bleeding. ? Dimpling. ? Redness. ? A change in position. Feel for Changes  Carefully feel your breasts for lumps and changes. It is best to do this while lying on your back on the floor and again while sitting or standing in the shower or tub with soapy water on your skin. Feel each breast in the following way:  Place the arm on the side of the breast you are examining above your head.  Feel your breast with the other hand.  Start in the nipple area and make  inch (2 cm) overlapping circles to feel your breast. Use the pads of your three middle fingers to do this. Apply light pressure, then medium pressure, then firm pressure. The light pressure will allow you to feel the tissue closest to the skin. The medium pressure will allow you to feel the tissue that is a little deeper. The firm pressure will allow you to feel the tissue close to the ribs.  Continue the overlapping circles, moving downward over the breast until you feel your ribs below your breast.  Move one finger-width toward the center of the body. Continue to use the  inch (2 cm) overlapping circles to feel your breast as you move slowly up toward your collarbone.  Continue the up and down exam using all three pressures until you reach your armpit.  Write Down What You Find  Write down what is normal for each breast and any changes that you find. Keep a written record with breast changes or normal findings for each breast. By writing this information down, you do not need to depend only on memory for  size, tenderness, or location. Write down where you are in your menstrual cycle, if you are still menstruating. If you are having trouble noticing differences in your breasts, do not get discouraged. With time you will become more familiar with the variations in your breasts and more comfortable with the exam. How often should I examine my breasts? Examine your breasts every month. If you are breastfeeding, the best time to examine your breasts is after a feeding or after using a breast pump. If you menstruate, the best time to examine your breasts is 5-7 days after your  period is over. During your period, your breasts are lumpier, and it may be more difficult to notice changes. When should I see my health care provider? See your health care provider if you notice:  A change in shape or size of your breasts or nipples.  A change in the skin of your breast or nipples, such as a reddened or scaly area.  Unusual discharge from your nipples.  A lump or thick area that was not there before.  Pain in your breasts.  Anything that concerns you.  This information is not intended to replace advice given to you by your health care provider. Make sure you discuss any questions you have with your health care provider. Document Released: 04/17/2005 Document Revised: 09/23/2015 Document Reviewed: 03/07/2015 Elsevier Interactive Patient Education  [employer].

## 2018-01-01 NOTE — Progress Notes (Signed)
27 y.o. G2P0020 SingleAfrican AmericanF here for annual exam. She has a mirena IUD, inserted in 7/18 with ultrasound guidance. She has a known fibroid uterus and previously expulsed an IUD.  Her largest fibroid was 11 cm at her last ultrasound in 7/18 and she does have distortion of her endometrial cavity. Initially her cycles were controlled with the IUD, but over the last few months her cycles have gotten bad again.  Currently her cycles are coming every 3 weeks and lasting 10-15 days. Bleeding more days than not. She is saturating a super + tampon in up to an hour.  Period Cycle (Days): 15 Period Duration (Days): 10-15  Period Pattern: (!) Irregular Menstrual Flow: Light, Moderate Menstrual Control: Maxi pad, Tampon Dysmenorrhea: (!) Moderate Dysmenorrhea Symptoms: Cramping  She has been with the same partner x 2 years, live together. Sometimes uses condoms.  She was treated for trich in 5/19. She and her partner were treated, she has had it on and off, thinks they aren't waiting long enough for the antibiotics to work. Other std testing was negative in 5/19.  She c/o a 1 week of vaginal odor, no discharge.  She has some urgency to void, no change in frequency, no dysuria.  Patient's last menstrual period was 12/31/2017.          Sexually active: Yes.    The current method of family planning is IUD.    Exercising: Yes.    walking Smoker:  no  Health Maintenance: Pap:  10-19-16 negative  History of abnormal Pap:  yes TDaP:  09-01-16  Gardasil: completed x3    reports that she has never smoked. She has never used smokeless tobacco. She reports that she drinks about 1.0 standard drinks of alcohol per week. She reports that she does not use drugs. Has a masters degree in counseling. Plans to start seeing patients.  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   Past Medical History:  Diagnosis Date  . Anemia   . Blood in stool   . Chicken  pox   . GERD (gastroesophageal reflux disease)   . STD (sexually transmitted disease) 08/2016   Trich   . Urinary tract infection     Past Surgical History:  Procedure Laterality Date  . COLPOSCOPY    . gallstones    . INTRAUTERINE DEVICE (IUD) INSERTION  10/2016   Mirena     Current Outpatient Medications  Medication Sig Dispense Refill  . folic acid (FOLVITE) 0.5 MG tablet Take 0.5 mg by mouth daily.    Marland Kitchen levonorgestrel (MIRENA) 20 MCG/24HR IUD 1 each by Intrauterine route once.     No current facility-administered medications for this visit.     Family History  Problem Relation Age of Onset  . Arthritis Mother   . Hypertension Mother   . Diabetes Mother   . Arthritis Father   . Heart disease Father   . Stroke Father   . Hypertension Father   . Diabetes Father   . Hypertension Maternal Grandmother   . Stroke Maternal Grandmother   . Heart disease Maternal Grandmother   . Hypertension Maternal Grandfather   . Stroke Maternal Grandfather   . Heart disease Maternal Grandfather   . Hypertension Paternal Grandmother   . Stroke Paternal Grandmother   . Heart disease Paternal Grandmother   . Hypertension Paternal Grandfather   . Stroke Paternal Grandfather   . Heart disease Paternal Grandfather     Review of  Systems  Constitutional: Negative.   HENT: Negative.   Eyes: Negative.   Respiratory: Negative.   Cardiovascular: Negative.   Gastrointestinal: Positive for abdominal distention, blood in stool and constipation.  Endocrine: Negative.   Genitourinary:       Excessive bleeding Painful period Menstrual cycle changes Unscheduled bleeding or spotting   Musculoskeletal: Negative.   Skin: Negative.   Allergic/Immunologic: Negative.   Neurological: Negative.   Hematological: Negative.   Psychiatric/Behavioral: Negative.     Exam:   BP 136/86 (BP Location: Right Arm, Patient Position: Sitting, Cuff Size: Large)   Pulse 86   Resp 20   Ht 5' 10.5" (1.791  m)   Wt (!) 359 lb (162.8 kg)   LMP 12/31/2017   BMI 50.78 kg/m   Weight change: @WEIGHTCHANGE @ Height:   Height: 5' 10.5" (179.1 cm)  Ht Readings from Last 3 Encounters:  01/01/18 5' 10.5" (1.791 m)  10/02/17 5' 9.25" (1.759 m)  09/07/17 5' 9.25" (1.759 m)    General appearance: alert, cooperative and appears stated age Head: Normocephalic, without obvious abnormality, atraumatic Neck: no adenopathy, supple, symmetrical, trachea midline and thyroid normal to inspection and palpation Lungs: clear to auscultation bilaterally Cardiovascular: regular rate and rhythm Breasts: normal appearance, no masses or tenderness Abdomen: soft, non-tender; non distended,  Uterus palpated in her mid lower abdomen Extremities: extremities normal, atraumatic, no cyanosis or edema Skin: Skin color, texture, turgor normal. No rashes or lesions Lymph nodes: Cervical, supraclavicular, and axillary nodes normal. No abnormal inguinal nodes palpated Neurologic: Grossly normal   Pelvic: External genitalia:  no lesions              Urethra:  normal appearing urethra with no masses, tenderness or lesions              Bartholins and Skenes: normal                 Vagina: normal appearing vagina with normal color and discharge, no lesions              Cervix: no lesions and IUD string 3 cm. IUD removed with ringed forceps (not being expulsed)               Bimanual Exam:  Uterus:  enlarged, exam limited by BMI, uterus is not deep in her pelvis.               Adnexa: no mass, fullness, tenderness               Rectovaginal: Confirms               Anus:  normal sphincter tone, no lesions  Chaperone was present for exam.  A:  Well Woman exam  Fibroid uterus, bleeding no longer controlled with the mirena IUD  Menorrhagia  H/O Trich, other std testing was negative in 5/19. Both the patient and her partner were treated  Vaginal odor  Urinary urgency  P:   No pap this year  Affirm sent  CBC, TSH  UPT  negative  IUD removed   Will recheck BP  Discussed OCP's, micronor, depo-provera (wouldn't recommend with her weight)  Reviewed risks of OCP's  Will send for a consultation to Dr Kerin Perna to evaluate her for possible myomectomy    Addendum: BP recheck was 146/88 Will call in micronor for 3 months Then f/u  In addition to her annual exam, another 10+ minutes was spent in counseling for her other c/o

## 2018-01-02 LAB — CBC
HEMATOCRIT: 37.4 % (ref 34.0–46.6)
Hemoglobin: 12.7 g/dL (ref 11.1–15.9)
MCH: 31.3 pg (ref 26.6–33.0)
MCHC: 34 g/dL (ref 31.5–35.7)
MCV: 92 fL (ref 79–97)
PLATELETS: 343 10*3/uL (ref 150–450)
RBC: 4.06 x10E6/uL (ref 3.77–5.28)
RDW: 12.6 % (ref 12.3–15.4)
WBC: 7.7 10*3/uL (ref 3.4–10.8)

## 2018-01-02 LAB — URINALYSIS, MICROSCOPIC ONLY: Casts: NONE SEEN /lpf

## 2018-01-02 LAB — VAGINITIS/VAGINOSIS, DNA PROBE
CANDIDA SPECIES: NEGATIVE
Gardnerella vaginalis: NEGATIVE
Trichomonas vaginosis: POSITIVE — AB

## 2018-01-02 LAB — FERRITIN: FERRITIN: 74 ng/mL (ref 15–150)

## 2018-01-02 LAB — URINE CULTURE: Organism ID, Bacteria: NO GROWTH

## 2018-01-02 LAB — TSH: TSH: 2.26 u[IU]/mL (ref 0.450–4.500)

## 2018-01-03 ENCOUNTER — Telehealth: Payer: Self-pay | Admitting: *Deleted

## 2018-01-03 ENCOUNTER — Telehealth: Payer: Self-pay

## 2018-01-03 MED ORDER — METRONIDAZOLE 500 MG PO TABS: 2000.0000 mg | ORAL_TABLET | Freq: Once | ORAL | 0 refills | Status: AC

## 2018-01-03 NOTE — Telephone Encounter (Signed)
Signed Rx for EPT picked up by patient in office.   Encounter closed.

## 2018-01-03 NOTE — Telephone Encounter (Signed)
-----   Message from Salvadore Dom, MD sent at 01/03/2018 10:31 AM EDT ----- Please let the patient know that her vaginitis panel returned + for Trich, she and her partner were recently treated for this by her primary. Please treat with flagyl 2 gm po x 1, offer expedited partner therapy. They should abstain from intercourse for 1 week after they are both treated and then use condoms. Please set her up for retesting for trich in 3 weeks.  Urine culture is negative

## 2018-01-03 NOTE — Telephone Encounter (Signed)
-----   Message from Salvadore Dom, MD sent at 01/02/2018  4:12 PM EDT ----- Please let the patient know that her blood work is normal. Urinalysis had blood and WBC, but this could be a contaminant from her vagina. Her urine culture and affirm are pending.

## 2018-01-03 NOTE — Telephone Encounter (Signed)
Left message informing patient of current results.

## 2018-01-03 NOTE — Telephone Encounter (Signed)
Patient returning call to Jill. °

## 2018-01-03 NOTE — Telephone Encounter (Signed)
Spoke with patient. Advised as seen below per Dr. Talbert Nan. Rx for Flagyl to verified pharmacy. Partner information obtained for EPT Rx. Advised patient she will need to pick written Rx up from office, will be placed at front office for pick up. OV schdueld for 01/24/18 at 11:30am with Dr. Talbert Nan. Patient verbalizes understanding and is agreeable.   Written Rx for Flagy to Dr. Talbert Nan for signature.

## 2018-01-03 NOTE — Telephone Encounter (Signed)
Notes recorded by Burnice Logan, RN on 01/03/2018 at 10:36 AM EDT Left message to call Sharee Pimple at [phone].

## 2018-01-15 ENCOUNTER — Other Ambulatory Visit: Payer: Self-pay | Admitting: *Deleted

## 2018-01-15 ENCOUNTER — Telehealth: Payer: Self-pay | Admitting: Obstetrics and Gynecology

## 2018-01-15 DIAGNOSIS — D259 Leiomyoma of uterus, unspecified: Secondary | ICD-10-CM

## 2018-01-15 NOTE — Telephone Encounter (Signed)
Patient is asking for the status of her referral to [facility].

## 2018-01-15 NOTE — Telephone Encounter (Signed)
Routing to Linda Ochoa for f/u.  

## 2018-01-22 NOTE — Progress Notes (Signed)
GYNECOLOGY  VISIT   HPI: 27 y.o.   Single Black or African American Not Hispanic or Latino  female   G2P0020 with Patient's last menstrual period was 12/31/2017.   here for test of cure for trich.   She was treated in 5/19, doesn't think she and her partner abstained long enough after treatment. Positive for trich again on 01/01/18. Other STD testing was negative.  The patient was seen earlier this month for an annual exam. She has a known fibroid uterus, cycles were controlled with a mirena IUD until a few months prior to that visit. At that time she was c/o menorrhagia and requested IUD removal and to start on OCP's. Her BP was elevated so she was started on micronor. She was also referred to Dr Kerin Perna for consult for possible myomectomy. Has an appointment to see him in November. She decided not to take the minipill and try to get pregnant.  She is on PNV. H/O irregular cycles prior to her IUD. H/O GC or CT in college.   GYNECOLOGIC HISTORY: Patient's last menstrual period was 12/31/2017. Contraception:none Menopausal hormone therapy: none        OB History    Gravida  2   Para      Term      Preterm      AB  2   Living        SAB      TAB      Ectopic      Multiple      Live Births                 Patient Active Problem List   Diagnosis Date Noted  . Morbid obesity with BMI of 50.0-59.9, adult ([place]) 09/07/2017    Past Medical History:  Diagnosis Date  . Anemia   . Blood in stool   . Chicken pox   . GERD (gastroesophageal reflux disease)   . STD (sexually transmitted disease) 08/2016   Trich   . Urinary tract infection     Past Surgical History:  Procedure Laterality Date  . COLPOSCOPY    . gallstones    . INTRAUTERINE DEVICE (IUD) INSERTION  10/2016   Mirena     Current Outpatient Medications  Medication Sig Dispense Refill  . folic acid (FOLVITE) 0.5 MG tablet Take 0.5 mg by mouth daily.     No current facility-administered medications for  this visit.      ALLERGIES: Patient has no known allergies.  Family History  Problem Relation Age of Onset  . Arthritis Mother   . Hypertension Mother   . Diabetes Mother   . Arthritis Father   . Heart disease Father   . Stroke Father   . Hypertension Father   . Diabetes Father   . Hypertension Maternal Grandmother   . Stroke Maternal Grandmother   . Heart disease Maternal Grandmother   . Hypertension Maternal Grandfather   . Stroke Maternal Grandfather   . Heart disease Maternal Grandfather   . Hypertension Paternal Grandmother   . Stroke Paternal Grandmother   . Heart disease Paternal Grandmother   . Hypertension Paternal Grandfather   . Stroke Paternal Grandfather   . Heart disease Paternal Grandfather     Social History   Socioeconomic History  . Marital status: Single    Spouse name: Not on file  . Number of children: Not on file  . Years of education: Not on file  . Highest education level:  Not on file  Occupational History  . Not on file  Social Needs  . Financial resource strain: Not on file  . Food insecurity:    Worry: Not on file    Inability: Not on file  . Transportation needs:    Medical: Not on file    Non-medical: Not on file  Tobacco Use  . Smoking status: Never Smoker  . Smokeless tobacco: Never Used  Substance and Sexual Activity  . Alcohol use: Yes    Alcohol/week: 1.0 standard drinks    Types: 1 Standard drinks or equivalent per week  . Drug use: No  . Sexual activity: Yes    Partners: Male    Birth control/protection: IUD    Comment: MIrena   Lifestyle  . Physical activity:    Days per week: Not on file    Minutes per session: Not on file  . Stress: Not on file  Relationships  . Social connections:    Talks on phone: Not on file    Gets together: Not on file    Attends religious service: Not on file    Active member of club or organization: Not on file    Attends meetings of clubs or organizations: Not on file     Relationship status: Not on file  . Intimate partner violence:    Fear of current or ex partner: Not on file    Emotionally abused: Not on file    Physically abused: Not on file    Forced sexual activity: Not on file  Other Topics Concern  . Not on file  Social History Narrative  . Not on file    Review of Systems  Constitutional: Negative.   HENT: Negative.   Eyes: Negative.   Respiratory: Negative.   Cardiovascular: Negative.   Gastrointestinal: Negative.   Endocrine: Negative.   Genitourinary: Negative.   Musculoskeletal: Negative.   Skin: Negative.   Allergic/Immunologic: Negative.   Neurological: Negative.   Hematological: Negative.   Psychiatric/Behavioral: Negative.   All other systems reviewed and are negative.   PHYSICAL EXAMINATION:    BP (!) 142/98   Pulse 79   Wt (!) 354 lb (160.6 kg)   LMP 12/31/2017   BMI 50.08 kg/m     General appearance: alert, cooperative and appears stated age  Pelvic: External genitalia:  no lesions              Urethra:  normal appearing urethra with no masses, tenderness or lesions              Bartholins and Skenes: normal                 Vagina: normal appearing vagina with normal color and discharge, no lesions              Cervix: no lesions  Chaperone was present for exam.  ASSESSMENT H/O trich, s/p treatment. Other STD testing was negative H/O fibroid uterus, menorrhagia, just had mirena IUD removed H/O irregular cycles prior to IUD Desires pregnancy, didn't start micronor H/O GC or CT in college    PLAN Affirm sent Calendar cycles She has an appointment with Dr Kerin Perna in November Call with heavy bleeding or if skipping cycles Call with + UPT, discussed risk of ectopic Continue on PNV    An After Visit Summary was printed and given to the patient.  ~15 minutes face to face time of which over 50% was spent in counseling.

## 2018-01-24 ENCOUNTER — Ambulatory Visit (INDEPENDENT_AMBULATORY_CARE_PROVIDER_SITE_OTHER): Payer: BLUE CROSS/BLUE SHIELD | Admitting: Obstetrics and Gynecology

## 2018-01-24 ENCOUNTER — Encounter: Payer: Self-pay | Admitting: Obstetrics and Gynecology

## 2018-01-24 VITALS — BP 142/98 | HR 79 | Wt 354.0 lb

## 2018-01-24 DIAGNOSIS — D259 Leiomyoma of uterus, unspecified: Secondary | ICD-10-CM | POA: Diagnosis not present

## 2018-01-24 DIAGNOSIS — N926 Irregular menstruation, unspecified: Secondary | ICD-10-CM | POA: Diagnosis not present

## 2018-01-24 DIAGNOSIS — Z3009 Encounter for other general counseling and advice on contraception: Secondary | ICD-10-CM

## 2018-01-24 DIAGNOSIS — Z8619 Personal history of other infectious and parasitic diseases: Secondary | ICD-10-CM

## 2018-01-25 LAB — VAGINITIS/VAGINOSIS, DNA PROBE
CANDIDA SPECIES: NEGATIVE
GARDNERELLA VAGINALIS: POSITIVE — AB
Trichomonas vaginosis: NEGATIVE

## 2018-01-28 ENCOUNTER — Telehealth: Payer: Self-pay | Admitting: *Deleted

## 2018-01-28 MED ORDER — METRONIDAZOLE 500 MG PO TABS: 500.0000 mg | ORAL_TABLET | Freq: Two times a day (BID) | ORAL | 0 refills | Status: AC

## 2018-01-28 NOTE — Telephone Encounter (Signed)
-----   Message from Salvadore Dom, MD sent at 01/25/2018  3:25 PM EDT ----- Please inform the patient that she is negative for trich!! She is + for BV, only needs treatment if she is symptomatic. If symptomatic, please treat with Flagyl (oral or vaginal).  Oral: Flagyl 500 mg BID x 7 days, or Vaginal: Metrogel, 1 applicator per vagina q day x 5 days.

## 2018-01-28 NOTE — Telephone Encounter (Signed)
Call to patient. Advised patient of results as seen below from Dr. Talbert Nan. Patient verbalized understanding. Patient states she is having some vaginal odor. Requesting to treat with flagyl. Instructions on use and ETOH precautions reviewed with patient and she verbalized understanding. Pharmacy on file confirmed. Advised patient to return call to office if symptoms do not resolve after treatment. Patient agreeable. Prescription for flagyl 500mg , #14, 0RF sent to pharmacy on file.   Routing to provider for final review. Patient agreeable to disposition. Will close encounter.

## 2018-02-01 NOTE — Telephone Encounter (Signed)
Rosa, okay to close this encounter? °

## 2018-02-13 NOTE — Telephone Encounter (Signed)
Okay to close this encounter? °

## 2018-03-06 NOTE — Telephone Encounter (Signed)
Okay to close encounter?  °

## 2018-04-09 NOTE — Progress Notes (Signed)
GYNECOLOGY  VISIT   HPI: 27 y.o.   Single Black or African American Not Hispanic or Latino  female   G2P0020 with Patient's last menstrual period was 04/04/2018 (approximate).   here for 3 month follow up on menstrual cycles.  The patient has a fibroid uterus, her cycles were controlled with the mirena IUD for a while, then developed menorrhagia. The IUD was pulled in 9/19. She was given a script for micronor, started it about 6 weeks ago Since the IUD was pulled her cycles have been a little lighter. She has been having on and off spotting.  She saw Dr Kerin Perna in the last 1-2 weeks, he but her on Aygestin and letrozole.   GYNECOLOGIC HISTORY: Patient's last menstrual period was 04/04/2018 (approximate). Contraception: none  Menopausal hormone therapy: none         OB History    Gravida  2   Para  0   Term  0   Preterm  0   AB  2   Living  0     SAB  0   TAB  0   Ectopic  0   Multiple  0   Live Births  0              Patient Active Problem List   Diagnosis Date Noted  . Morbid obesity with BMI of 50.0-59.9, adult ([place]) 09/07/2017    Past Medical History:  Diagnosis Date  . Anemia   . Blood in stool   . Chicken pox   . GERD (gastroesophageal reflux disease)   . STD (sexually transmitted disease) 08/2016   Trich   . Urinary tract infection     Past Surgical History:  Procedure Laterality Date  . COLPOSCOPY    . gallstones    . INTRAUTERINE DEVICE (IUD) INSERTION  10/2016   Mirena     Current Outpatient Medications  Medication Sig Dispense Refill  . folic acid (FOLVITE) 0.5 MG tablet Take 0.5 mg by mouth daily.    Marland Kitchen letrozole (FEMARA) 2.5 MG tablet Take 2.5 mg by mouth daily.     No current facility-administered medications for this visit.      ALLERGIES: Patient has no known allergies.  Family History  Problem Relation Age of Onset  . Arthritis Mother   . Hypertension Mother   . Diabetes Mother   . Arthritis Father   . Heart disease  Father   . Stroke Father   . Hypertension Father   . Diabetes Father   . Hypertension Maternal Grandmother   . Stroke Maternal Grandmother   . Heart disease Maternal Grandmother   . Hypertension Maternal Grandfather   . Stroke Maternal Grandfather   . Heart disease Maternal Grandfather   . Hypertension Paternal Grandmother   . Stroke Paternal Grandmother   . Heart disease Paternal Grandmother   . Hypertension Paternal Grandfather   . Stroke Paternal Grandfather   . Heart disease Paternal Grandfather     Social History   Socioeconomic History  . Marital status: Single    Spouse name: Not on file  . Number of children: Not on file  . Years of education: Not on file  . Highest education level: Not on file  Occupational History  . Not on file  Social Needs  . Financial resource strain: Not on file  . Food insecurity:    Worry: Not on file    Inability: Not on file  . Transportation needs:    Medical:  Not on file    Non-medical: Not on file  Tobacco Use  . Smoking status: Never Smoker  . Smokeless tobacco: Never Used  Substance and Sexual Activity  . Alcohol use: Yes    Alcohol/week: 1.0 standard drinks    Types: 1 Standard drinks or equivalent per week  . Drug use: No  . Sexual activity: Yes    Partners: Male    Birth control/protection: I.U.D.    Comment: MIrena   Lifestyle  . Physical activity:    Days per week: Not on file    Minutes per session: Not on file  . Stress: Not on file  Relationships  . Social connections:    Talks on phone: Not on file    Gets together: Not on file    Attends religious service: Not on file    Active member of club or organization: Not on file    Attends meetings of clubs or organizations: Not on file    Relationship status: Not on file  . Intimate partner violence:    Fear of current or ex partner: Not on file    Emotionally abused: Not on file    Physically abused: Not on file    Forced sexual activity: Not on file  Other  Topics Concern  . Not on file  Social History Narrative  . Not on file    Review of Systems  Constitutional: Negative.   HENT: Negative.   Eyes: Negative.   Respiratory: Negative.   Cardiovascular: Negative.   Gastrointestinal: Negative.   Genitourinary:       Irregular bleeding   Musculoskeletal: Positive for joint pain and myalgias.       Muscle weakness   Skin: Negative.   Neurological: Negative.   Endo/Heme/Allergies: Negative.   Psychiatric/Behavioral: Negative.     PHYSICAL EXAMINATION:    BP 134/82 (BP Location: Right Arm, Patient Position: Sitting, Cuff Size: Large)   Pulse 90   Resp 16   Ht 5\' 9"  (1.753 m)   Wt (!) 359 lb 4 oz (163 kg)   LMP 04/04/2018 (Approximate)   BMI 53.05 kg/m     General appearance: alert, cooperative and appears stated age  ASSESSMENT Symptomatic fibroid uterus, bleeding has improved since the mirena was removed Desires pregnancy    PLAN Planning myomectomy with Dr Kerin Perna She is on Letrozole and Aygestin Will get a copy of Dr Charlett Lango visit.     An After Visit Summary was printed and given to the patient.

## 2018-04-11 ENCOUNTER — Ambulatory Visit: Payer: BLUE CROSS/BLUE SHIELD | Admitting: Obstetrics and Gynecology

## 2018-04-11 ENCOUNTER — Other Ambulatory Visit: Payer: Self-pay

## 2018-04-11 ENCOUNTER — Encounter: Payer: Self-pay | Admitting: Obstetrics and Gynecology

## 2018-04-11 VITALS — BP 134/82 | HR 90 | Resp 16 | Ht 69.0 in | Wt 359.2 lb

## 2018-04-11 DIAGNOSIS — D259 Leiomyoma of uterus, unspecified: Secondary | ICD-10-CM | POA: Diagnosis not present

## 2018-04-11 DIAGNOSIS — N939 Abnormal uterine and vaginal bleeding, unspecified: Secondary | ICD-10-CM | POA: Diagnosis not present

## 2018-06-26 ENCOUNTER — Other Ambulatory Visit: Payer: Self-pay | Admitting: Obstetrics and Gynecology

## 2018-06-26 NOTE — Telephone Encounter (Signed)
Spoke with patient. Patient requesting refill of betamethasone valerate ointment 0.1%. Patient states she applies this after shaving to stop irritation, is out of RX. Denies any GYN concerns or symptoms. Confirmed pharmacy on file. Advised Dr. Nelson Chimes will review, our office will return call with recommendations. Patient agreeable.   Last AEX 01/28/18 Next AEX 01/09/19 Last filled 11/13/2016  Rx pended for betamethasone valerate ointment 0.1%. Dispense: 15 g/0RF  Routing to Dr. Talbert Nan

## 2018-06-26 NOTE — Telephone Encounter (Signed)
Patient would like a prescription for an ointment that was prescribed to her for after she shaves to stop irritation. [facility] on [address] [phone]

## 2018-06-26 NOTE — Telephone Encounter (Signed)
Left message to call Jerie Basford, RN at [facility] [phone].   

## 2018-06-27 MED ORDER — BETAMETHASONE VALERATE 0.1 % EX OINT: 1.0000 "application " | TOPICAL_OINTMENT | Freq: Two times a day (BID) | CUTANEOUS | 0 refills | Status: DC

## 2018-06-27 NOTE — Telephone Encounter (Signed)
Spoke with patient, advised as seen below per Dr. Jertson. Patient verbalizes understanding and is agreeable. Encounter closed.  

## 2018-06-27 NOTE — Telephone Encounter (Signed)
The refill has been sent. If she is getting so irritated by shaving, she should consider not shaving. She should use the steroid ointment sparingly, regular use can lead to thinning of the skin.

## 2018-09-13 ENCOUNTER — Encounter: Payer: Self-pay | Admitting: Family Medicine

## 2018-10-24 ENCOUNTER — Other Ambulatory Visit: Payer: Self-pay | Admitting: Obstetrics and Gynecology

## 2018-10-24 NOTE — Telephone Encounter (Signed)
Medication refill request: Norethindrone  Last AEX:  01-01-18 JJ  Next AEX: 01-09-2019 Last MMG (if hormonal medication request): n/a Refill authorized: Today, please advise.   Spoke with patient as POP not on current med list, patient states she is still taking daily. Medication pended for #84, 0RF. Please refill if appropriate.

## 2018-10-24 NOTE — Telephone Encounter (Signed)
Message left to return call to Emberleigh Reily at [phone].    

## 2018-10-24 NOTE — Telephone Encounter (Signed)
Can you check if the patient ever had surgery with Dr Kerin Perna (was condsidering myomectomy). I thought she was off of the minipill. I'm happy to refill it, but please find out how long she has been taking it for and how are her cycles? I want to make sure it is helping

## 2018-10-25 NOTE — Telephone Encounter (Signed)
Spoke with patient. She states her myomectomy got delayed due to Covid. She hasn't yet rescheduled. She restarted Norethindrone 2 months ago and cycles have not been quite as heavy. She would like refills until she reschedules surgery with Dr. Kerin Perna. Routed to [facility]

## 2018-10-28 NOTE — Telephone Encounter (Signed)
3 packs with one refill sent. She should f/u for an annual exam in 9/20. Thank you!

## 2019-01-09 ENCOUNTER — Ambulatory Visit: Payer: BLUE CROSS/BLUE SHIELD | Admitting: Obstetrics and Gynecology

## 2019-05-13 MED FILL — metroNIDAZOLE 500 MG TABS: 500 | 1 days supply | Qty: 4 | Fill #0

## 2019-10-22 ENCOUNTER — Telehealth: Payer: Self-pay

## 2019-10-22 NOTE — Telephone Encounter (Signed)
Left message to call Sharee Pimple, RN at [facility].   Last AEX 01/01/18 w/ Dr. Talbert Nan Last OV 04/11/18

## 2019-10-22 NOTE — Telephone Encounter (Signed)
Patient is calling in regards to getting the name of the doctor our office referred her to for imaging.

## 2019-10-22 NOTE — Telephone Encounter (Signed)
Spoke with patient. Patient states she was referred to a specialist in 2019 for surgery, she went for the appt, but can not recall who the provider was. Patient is trying to return for f/u and to discuss surgery planning.   Per review of Epic, patient was seen on 04/11/18, AEX 01/01/18.  Patient was planning a myomectomy with Dr. Kerin Perna -contact information provided.   Advised overdue for AEX, offered to schedule. Patient agreeable to schedule after August 2021. AEX scheduled for 02/05/20 at 4pm.   Routing to provider for final review. Patient is agreeable to disposition. Will close encounter.

## 2020-02-05 ENCOUNTER — Other Ambulatory Visit (HOSPITAL_COMMUNITY)
Admission: RE | Admit: 2020-02-05 | Discharge: 2020-02-05 | Disposition: A | Discharge status: Home / Self Care | Payer: BLUE CROSS/BLUE SHIELD | Source: Ambulatory Visit | Attending: Obstetrics and Gynecology | Admitting: Obstetrics and Gynecology

## 2020-02-05 ENCOUNTER — Ambulatory Visit (INDEPENDENT_AMBULATORY_CARE_PROVIDER_SITE_OTHER): Payer: No Typology Code available for payment source | Admitting: Obstetrics and Gynecology

## 2020-02-05 ENCOUNTER — Encounter: Payer: Self-pay | Admitting: Obstetrics and Gynecology

## 2020-02-05 ENCOUNTER — Other Ambulatory Visit: Payer: Self-pay

## 2020-02-05 VITALS — BP 140/80 | HR 89 | Ht 69.0 in | Wt 377.0 lb

## 2020-02-05 DIAGNOSIS — Z6841 Body Mass Index (BMI) 40.0 and over, adult: Secondary | ICD-10-CM

## 2020-02-05 DIAGNOSIS — Z113 Encounter for screening for infections with a predominantly sexual mode of transmission: Secondary | ICD-10-CM

## 2020-02-05 DIAGNOSIS — N3090 Cystitis, unspecified without hematuria: Secondary | ICD-10-CM | POA: Diagnosis not present

## 2020-02-05 DIAGNOSIS — D259 Leiomyoma of uterus, unspecified: Secondary | ICD-10-CM

## 2020-02-05 DIAGNOSIS — Z01419 Encounter for gynecological examination (general) (routine) without abnormal findings: Secondary | ICD-10-CM

## 2020-02-05 DIAGNOSIS — Z124 Encounter for screening for malignant neoplasm of cervix: Secondary | ICD-10-CM

## 2020-02-05 DIAGNOSIS — Z0000 Encounter for general adult medical examination without abnormal findings: Secondary | ICD-10-CM

## 2020-02-05 LAB — POCT URINALYSIS DIPSTICK
Bilirubin, UA: NEGATIVE
Glucose, UA: NEGATIVE
Ketones, UA: NEGATIVE
Nitrite, UA: NEGATIVE
Protein, UA: NEGATIVE
Urobilinogen, UA: NEGATIVE E.U./dL — AB
pH, UA: 7 (ref 5.0–8.0)

## 2020-02-05 MED ORDER — SULFAMETHOXAZOLE-TRIMETHOPRIM 800-160 MG PO TABS: 1.0000 | ORAL_TABLET | Freq: Two times a day (BID) | ORAL | 0 refills | Status: DC

## 2020-02-05 MED ORDER — PHENAZOPYRIDINE HCL 200 MG PO TABS: 200.0000 mg | ORAL_TABLET | Freq: Three times a day (TID) | ORAL | 0 refills | Status: DC | PRN

## 2020-02-05 NOTE — Patient Instructions (Signed)
EXERCISE AND DIET:  We recommended that you start or continue a regular exercise program for good health. Regular exercise means any activity that makes your heart beat faster and makes you sweat.  We recommend exercising at least 30 minutes per day at least 3 days a week, preferably 4 or 5.  We also recommend a diet low in fat and sugar.  Inactivity, poor dietary choices and obesity can cause diabetes, heart attack, stroke, and kidney damage, among others.    ALCOHOL AND SMOKING:  Women should limit their alcohol intake to no more than 7 drinks/beers/glasses of wine (combined, not each!) per week. Moderation of alcohol intake to this level decreases your risk of breast cancer and liver damage. And of course, no recreational drugs are part of a healthy lifestyle.  And absolutely no smoking or even second hand smoke. Most people know smoking can cause heart and lung diseases, but did you know it also contributes to weakening of your bones? Aging of your skin?  Yellowing of your teeth and nails?  CALCIUM AND VITAMIN D:  Adequate intake of calcium and Vitamin D are recommended.  The recommendations for exact amounts of these supplements seem to change often, but generally speaking 1,000 mg of calcium (between diet and supplement) and 800 units of Vitamin D per day seems prudent. Certain women may benefit from higher intake of Vitamin D.  If you are among these women, your doctor will have told you during your visit.    PAP SMEARS:  Pap smears, to check for cervical cancer or precancers,  have traditionally been done yearly, although recent scientific advances have shown that most women can have pap smears less often.  However, every woman still should have a physical exam from her gynecologist every year. It will include a breast check, inspection of the vulva and vagina to check for abnormal growths or skin changes, a visual exam of the cervix, and then an exam to evaluate the size and shape of the uterus and  ovaries.  And after 31 years of age, a rectal exam is indicated to check for rectal cancers. We will also provide age appropriate advice regarding health maintenance, like when you should have certain vaccines, screening for sexually transmitted diseases, bone density testing, colonoscopy, mammograms, etc.   MAMMOGRAMS:  All women over 40 years old should have a yearly mammogram. Many facilities now offer a "3D" mammogram, which may cost around $50 extra out of pocket. If possible,  we recommend you accept the option to have the 3D mammogram performed.  It both reduces the number of women who will be called back for extra views which then turn out to be normal, and it is better than the routine mammogram at detecting truly abnormal areas.    COLON CANCER SCREENING: Now recommend starting at age 45. At this time colonoscopy is not covered for routine screening until 50. There are take home tests that can be done between 45-49.   COLONOSCOPY:  Colonoscopy to screen for colon cancer is recommended for all women at age 50.  We know, you hate the idea of the prep.  We agree, BUT, having colon cancer and not knowing it is worse!!  Colon cancer so often starts as a polyp that can be seen and removed at colonscopy, which can quite literally save your life!  And if your first colonoscopy is normal and you have no family history of colon cancer, most women don't have to have it again for   10 years.  Once every ten years, you can do something that may end up saving your life, right?  We will be happy to help you get it scheduled when you are ready.  Be sure to check your insurance coverage so you understand how much it will cost.  It may be covered as a preventative service at no cost, but you should check your particular policy.      Breast Self-Awareness Breast self-awareness means being familiar with how your breasts look and feel. It involves checking your breasts regularly and reporting any changes to your  health care provider. Practicing breast self-awareness is important. A change in your breasts can be a sign of a serious medical problem. Being familiar with how your breasts look and feel allows you to find any problems early, when treatment is more likely to be successful. All women should practice breast self-awareness, including women who have had breast implants. How to do a breast self-exam One way to learn what is normal for your breasts and whether your breasts are changing is to do a breast self-exam. To do a breast self-exam: Look for Changes  1. Remove all the clothing above your waist. 2. Stand in front of a mirror in a room with good lighting. 3. Put your hands on your hips. 4. Push your hands firmly downward. 5. Compare your breasts in the mirror. Look for differences between them (asymmetry), such as: ? Differences in shape. ? Differences in size. ? Puckers, dips, and bumps in one breast and not the other. 6. Look at each breast for changes in your skin, such as: ? Redness. ? Scaly areas. 7. Look for changes in your nipples, such as: ? Discharge. ? Bleeding. ? Dimpling. ? Redness. ? A change in position. Feel for Changes Carefully feel your breasts for lumps and changes. It is best to do this while lying on your back on the floor and again while sitting or standing in the shower or tub with soapy water on your skin. Feel each breast in the following way:  Place the arm on the side of the breast you are examining above your head.  Feel your breast with the other hand.  Start in the nipple area and make  inch (2 cm) overlapping circles to feel your breast. Use the pads of your three middle fingers to do this. Apply light pressure, then medium pressure, then firm pressure. The light pressure will allow you to feel the tissue closest to the skin. The medium pressure will allow you to feel the tissue that is a little deeper. The firm pressure will allow you to feel the tissue  close to the ribs.  Continue the overlapping circles, moving downward over the breast until you feel your ribs below your breast.  Move one finger-width toward the center of the body. Continue to use the  inch (2 cm) overlapping circles to feel your breast as you move slowly up toward your collarbone.  Continue the up and down exam using all three pressures until you reach your armpit.  Write Down What You Find  Write down what is normal for each breast and any changes that you find. Keep a written record with breast changes or normal findings for each breast. By writing this information down, you do not need to depend only on memory for size, tenderness, or location. Write down where you are in your menstrual cycle, if you are still menstruating. If you are having trouble noticing differences   in your breasts, do not get discouraged. With time you will become more familiar with the variations in your breasts and more comfortable with the exam. How often should I examine my breasts? Examine your breasts every month. If you are breastfeeding, the best time to examine your breasts is after a feeding or after using a breast pump. If you menstruate, the best time to examine your breasts is 5-7 days after your period is over. During your period, your breasts are lumpier, and it may be more difficult to notice changes. When should I see my health care provider? See your health care provider if you notice:  A change in shape or size of your breasts or nipples.  A change in the skin of your breast or nipples, such as a reddened or scaly area.  Unusual discharge from your nipples.  A lump or thick area that was not there before.  Pain in your breasts.  Anything that concerns you.  

## 2020-02-05 NOTE — Progress Notes (Signed)
29 y.o. [id number] Single Black or African American Not Hispanic or Latino female here for annual exam.  H/O fibroid uterus and menorrhagia. Mirena IUD didn't work, didn't remember the mini-pill.  Sexually active, same partner x 4 years. Getting married next week.  Period Cycle (Days): 28 Period Duration (Days): 10 Period Pattern: Regular Menstrual Flow: Heavy Menstrual Control: Tampon, Maxi pad, Thin pad, Panty liner Menstrual Control Change Freq (Hours): 2-3 Dysmenorrhea: (!) Severe Dysmenorrhea Symptoms: Cramping   2 days of urinary frequency, voiding small to normal amounts. Some urgency to void. Slight discomfort.   Patient's last menstrual period was 01/06/2020.          Sexually active: Yes.    The current method of family planning is none.    Exercising: Yes.    walking Smoker:  none  Health Maintenance: Pap:  10-19-16 negative  History of abnormal Pap:  yes MMG:  Never  BMD:   Never  Colonoscopy: Never  TDaP:  09/21/16  Gardasil: Compete x3    reports that she has never smoked. She has never used smokeless tobacco. She reports current alcohol use of about 1.0 standard drink of alcohol per week. She reports that she does not use drugs. Works in [employer], academic.   Past Medical History:  Diagnosis Date   Anemia    Blood in stool    Chicken pox    GERD (gastroesophageal reflux disease)    STD (sexually transmitted disease) 08/2016   Trich    Urinary tract infection     Past Surgical History:  Procedure Laterality Date   COLPOSCOPY     gallstones     INTRAUTERINE DEVICE (IUD) INSERTION  10/2016   Mirena     No current outpatient medications on file.   No current facility-administered medications for this visit.    Family History  Problem Relation Age of Onset   Arthritis Mother    Hypertension Mother    Diabetes Mother    Arthritis Father    Heart disease Father    Stroke Father    Hypertension Father    Diabetes Father    Hypertension  Maternal Grandmother    Stroke Maternal Grandmother    Heart disease Maternal Grandmother    Hypertension Maternal Grandfather    Stroke Maternal Grandfather    Heart disease Maternal Grandfather    Hypertension Paternal Grandmother    Stroke Paternal Grandmother    Heart disease Paternal Grandmother    Hypertension Paternal Grandfather    Stroke Paternal Grandfather    Heart disease Paternal Grandfather     Review of Systems  Genitourinary: Positive for dysuria and frequency.  All other systems reviewed and are negative.   Exam:   BP 140/80    Pulse 89    Ht 5\' 9"  (1.753 m)    Wt (!) 377 lb (171 kg)    LMP 01/06/2020    SpO2 100%    BMI 55.67 kg/m   Weight change: @WEIGHTCHANGE @ Height:   Height: 5\' 9"  (175.3 cm)  Ht Readings from Last 3 Encounters:  02/05/20 5\' 9"  (1.753 m)  04/11/18 5\' 9"  (1.753 m)  01/01/18 5' 10.5" (1.791 m)    General appearance: alert, cooperative and appears stated age Head: Normocephalic, without obvious abnormality, atraumatic Neck: no adenopathy, supple, symmetrical, trachea midline and thyroid normal to inspection and palpation Lungs: clear to auscultation bilaterally Cardiovascular: regular rate and rhythm Breasts: normal appearance, no masses or tenderness Abdomen: soft, non-tender; non distended. Firm mass in  her lower abdomen up to the level of her umbilicus (c/w fibroid uterus) Extremities: extremities normal, atraumatic, no cyanosis or edema Skin: Skin color, texture, turgor normal. No rashes or lesions Lymph nodes: Cervical, supraclavicular, and axillary nodes normal. No abnormal inguinal nodes palpated Neurologic: Grossly normal   Pelvic: External genitalia:  no lesions              Urethra:  normal appearing urethra with no masses, tenderness or lesions              Bartholins and Skenes: normal                 Vagina: normal appearing vagina with normal color and discharge, no lesions              Cervix: no lesions                Bimanual Exam:  Uterus:  20 week sized firm uterus, decreased mobility, not tender              Adnexa: no mass, fullness, tenderness               Rectovaginal: Confirms               Anus:  normal sphincter tone, no lesions  Shanon Petty chaperoned for the exam.  A:  Well Woman with normal exam  Cystitis  Fibroid uterus, 20 week sized. Enlarged significantly from a few years ago  BMI 55  Elevated BP  P:   Pap with hpv  Screening STD  Screening labs, HgbA1C, TSH  Recheck BP  Referral to [place] to discuss myomectomy  # for weight loss clinic given

## 2020-02-06 LAB — URINALYSIS, MICROSCOPIC ONLY
Bacteria, UA: NONE SEEN
Casts: NONE SEEN /lpf
WBC, UA: >30 /hpf — AB (ref 0–5)

## 2020-02-07 LAB — LIPID PANEL
Chol/HDL Ratio: 3.7 ratio (ref 0.0–4.4)
Cholesterol, Total: 189 mg/dL (ref 100–199)
HDL: 51 mg/dL (ref >39)
LDL Chol Calc (NIH): 121 mg/dL — ABNORMAL HIGH (ref 0–99)
Triglycerides: 92 mg/dL (ref 0–149)
VLDL Cholesterol Cal: 17 mg/dL (ref 5–40)

## 2020-02-07 LAB — COMPREHENSIVE METABOLIC PANEL
ALT: 16 IU/L (ref 0–32)
AST: 16 IU/L (ref 0–40)
Albumin/Globulin Ratio: 1.5 (ref 1.2–2.2)
Albumin: 4.5 g/dL (ref 3.8–4.8)
Alkaline Phosphatase: 95 IU/L (ref 44–121)
BUN/Creatinine Ratio: 15 (ref 9–23)
BUN: 11 mg/dL (ref 6–20)
Bilirubin Total: 0.2 mg/dL (ref 0.0–1.2)
CO2: 23 mmol/L (ref 20–29)
Calcium: 9.8 mg/dL (ref 8.7–10.2)
Chloride: 104 mmol/L (ref 96–106)
Creatinine, Ser: 0.75 mg/dL (ref 0.57–1.00)
GFR calc Af Amer: 123 mL/min/{1.73_m2} (ref >59)
GFR calc non Af Amer: 107 mL/min/{1.73_m2} (ref >59)
Globulin, Total: 3 g/dL (ref 1.5–4.5)
Glucose: 92 mg/dL (ref 65–99)
Potassium: 4.3 mmol/L (ref 3.5–5.2)
Sodium: 139 mmol/L (ref 134–144)
Total Protein: 7.5 g/dL (ref 6.0–8.5)

## 2020-02-07 LAB — CBC
Hematocrit: 35.9 % (ref 34.0–46.6)
Hemoglobin: 11.8 g/dL (ref 11.1–15.9)
MCH: 29.5 pg (ref 26.6–33.0)
MCHC: 32.9 g/dL (ref 31.5–35.7)
MCV: 90 fL (ref 79–97)
Platelets: 258 10*3/uL (ref 150–450)
RBC: 4 x10E6/uL (ref 3.77–5.28)
RDW: 13.1 % (ref 11.7–15.4)
WBC: 9 10*3/uL (ref 3.4–10.8)

## 2020-02-07 LAB — HEMOGLOBIN A1C
Est. average glucose Bld gHb Est-mCnc: 105 mg/dL
Hgb A1c MFr Bld: 5.3 % (ref 4.8–5.6)

## 2020-02-07 LAB — RPR: RPR Ser Ql: NONREACTIVE

## 2020-02-07 LAB — URINE CULTURE: Organism ID, Bacteria: NO GROWTH

## 2020-02-07 LAB — TSH: TSH: 1.83 u[IU]/mL (ref 0.450–4.500)

## 2020-02-07 LAB — HIV ANTIBODY (ROUTINE TESTING W REFLEX): HIV Screen 4th Generation wRfx: NONREACTIVE

## 2020-02-09 LAB — CYTOLOGY - PAP
Chlamydia: NEGATIVE
Comment: NEGATIVE
Comment: NEGATIVE
Comment: NEGATIVE
Comment: NORMAL
Diagnosis: NEGATIVE
High risk HPV: NEGATIVE
Neisseria Gonorrhea: NEGATIVE
Trichomonas: POSITIVE — AB

## 2020-02-10 ENCOUNTER — Telehealth: Payer: Self-pay

## 2020-02-10 MED ORDER — METRONIDAZOLE 500 MG PO TABS: ORAL_TABLET | ORAL | 0 refills | Status: DC

## 2020-02-10 NOTE — Telephone Encounter (Signed)
-----   Message from Salvadore Dom, MD sent at 02/10/2020 12:02 PM EDT ----- Please let the patient know that her screening returned + for trich (STD). The rest of her STD testing is negative. Please treat with flagyl 500 mg BID x 7 days (no ETOH).  Offer expedited partner therapy with flagyl 2 grams po x 1. She is getting married this week. An alternative to the 7 day treatment for her is the 2 gram dose of flagyl x 1. In women the one x dose isn't as effective as the 7 day treatment. Her choice. They should avoid intercourse for one week after treatment, then use condoms. She needs repeat testing in one month. She had blood in her urine and should return in a few weeks (not during her cycle) for a repeat urine dip. If + for blood then send for micro ua.  Her LDL is mildly elevated. She should eat a diet low in saturated fats and exercise regularly. The rest of her lab work is normal. 02 pap recall

## 2020-02-10 NOTE — Telephone Encounter (Signed)
Spoke with patient. Results given. Patient verbalizes understanding. Patient would like to take Flagyl 2 gram po x 1. Rx sent to pharmacy on file. Aware she will need to abstain from intercourse until she and her partner have received treatment and for 1 week following. Will need to use condoms for protection against STD's with intercourse. Expedited partner therapy reviewed. Rx for partner to the front desk for pick up. Patient is aware of the need to return for follow up in 1 month for urine and TOC. 02 recall entered.  Routing to provider and will close encounter.

## 2020-02-25 ENCOUNTER — Ambulatory Visit: Payer: No Typology Code available for payment source | Admitting: Obstetrics and Gynecology

## 2020-02-25 ENCOUNTER — Encounter: Payer: Self-pay | Admitting: Obstetrics and Gynecology

## 2020-02-25 ENCOUNTER — Telehealth: Payer: Self-pay

## 2020-02-25 NOTE — Telephone Encounter (Signed)
Per review of Epic, patient to return for 1 mo TOC for trich and repeat urine.   Spoke with patient. Patient states she lost track of time, requesting to r/s appt  after 3pm. OV scheduled for 03/16/20 at 4:30pm. Patient is agreeable to date and time.   Encounter closed.

## 2020-02-25 NOTE — Telephone Encounter (Signed)
Patient did not keep office visit for today.

## 2020-03-12 ENCOUNTER — Ambulatory Visit: Payer: Self-pay

## 2020-03-12 DIAGNOSIS — Z23 Encounter for immunization: Secondary | ICD-10-CM

## 2020-03-16 ENCOUNTER — Ambulatory Visit (INDEPENDENT_AMBULATORY_CARE_PROVIDER_SITE_OTHER): Payer: No Typology Code available for payment source | Admitting: Obstetrics and Gynecology

## 2020-03-16 ENCOUNTER — Encounter: Payer: Self-pay | Admitting: Obstetrics and Gynecology

## 2020-03-16 ENCOUNTER — Other Ambulatory Visit: Payer: Self-pay

## 2020-03-16 VITALS — BP 148/86 | HR 68 | Resp 14 | Ht 69.0 in | Wt 377.2 lb

## 2020-03-16 DIAGNOSIS — Z87448 Personal history of other diseases of urinary system: Secondary | ICD-10-CM

## 2020-03-16 DIAGNOSIS — Z8619 Personal history of other infectious and parasitic diseases: Secondary | ICD-10-CM

## 2020-03-16 LAB — POCT URINALYSIS DIPSTICK
Bilirubin, UA: NEGATIVE
Glucose, UA: NEGATIVE
Ketones, UA: NEGATIVE
Nitrite, UA: NEGATIVE
Protein, UA: NEGATIVE
Urobilinogen, UA: NEGATIVE E.U./dL — AB
pH, UA: 7 (ref 5.0–8.0)

## 2020-03-16 NOTE — Progress Notes (Signed)
GYNECOLOGY  VISIT   HPI: 29 y.o.   Single Black or African American Not Hispanic or Latino  female   [phone] with No LMP recorded.   here for  TOC for trich.  No c/o. She and her husband were both treated.  Prior urine with blood, thinks she started her cycle around that time.   GYNECOLOGIC HISTORY: No LMP recorded. Contraception:none  Menopausal hormone therapy: NA         OB History    Gravida  2   Para  0   Term  0   Preterm  0   AB  2   Living  0     SAB  0   TAB  0   Ectopic  0   Multiple  0   Live Births  0              Patient Active Problem List   Diagnosis Date Noted  . Morbid obesity with BMI of 50.0-59.9, adult ([place]) 09/07/2017    Past Medical History:  Diagnosis Date  . Anemia   . Blood in stool   . Chicken pox   . GERD (gastroesophageal reflux disease)   . STD (sexually transmitted disease) 08/2016   Trich   . Urinary tract infection     Past Surgical History:  Procedure Laterality Date  . COLPOSCOPY    . gallstones      Current Outpatient Medications  Medication Sig Dispense Refill  . metroNIDAZOLE (FLAGYL) 500 MG tablet Take 2 grams (2 tablets) po x 1. 4 tablet 0  . phenazopyridine (PYRIDIUM) 200 MG tablet Take 1 tablet (200 mg total) by mouth 3 (three) times daily as needed. 6 tablet 0  . sulfamethoxazole-trimethoprim (BACTRIM DS) 800-160 MG tablet Take 1 tablet by mouth 2 (two) times daily. One PO BID x 3 days 6 tablet 0   No current facility-administered medications for this visit.     ALLERGIES: Patient has no known allergies.  Family History  Problem Relation Age of Onset  . Arthritis Mother   . Hypertension Mother   . Diabetes Mother   . Arthritis Father   . Heart disease Father   . Stroke Father   . Hypertension Father   . Diabetes Father   . Hypertension Maternal Grandmother   . Stroke Maternal Grandmother   . Heart disease Maternal Grandmother   . Hypertension Maternal Grandfather   . Stroke Maternal  Grandfather   . Heart disease Maternal Grandfather   . Hypertension Paternal Grandmother   . Stroke Paternal Grandmother   . Heart disease Paternal Grandmother   . Hypertension Paternal Grandfather   . Stroke Paternal Grandfather   . Heart disease Paternal Grandfather     Social History   Socioeconomic History  . Marital status: Single    Spouse name: Not on file  . Number of children: Not on file  . Years of education: Not on file  . Highest education level: Not on file  Occupational History  . Not on file  Tobacco Use  . Smoking status: Never Smoker  . Smokeless tobacco: Never Used  Substance and Sexual Activity  . Alcohol use: Yes    Alcohol/week: 1.0 standard drink    Types: 1 Standard drinks or equivalent per week  . Drug use: No  . Sexual activity: Yes    Partners: Male    Birth control/protection: I.U.D.    Comment: MIrena   Other Topics Concern  . Not on file  Social History Narrative  . Not on file   Social Determinants of Health   Financial Resource Strain:   . Difficulty of Paying Living Expenses: Not on file  Food Insecurity:   . Worried About Charity fundraiser in the Last Year: Not on file  . Ran Out of Food in the Last Year: Not on file  Transportation Needs:   . Lack of Transportation (Medical): Not on file  . Lack of Transportation (Non-Medical): Not on file  Physical Activity:   . Days of Exercise per Week: Not on file  . Minutes of Exercise per Session: Not on file  Stress:   . Feeling of Stress : Not on file  Social Connections:   . Frequency of Communication with Friends and Family: Not on file  . Frequency of Social Gatherings with Friends and Family: Not on file  . Attends Religious Services: Not on file  . Active Member of Clubs or Organizations: Not on file  . Attends Archivist Meetings: Not on file  . Marital Status: Not on file  Intimate Partner Violence:   . Fear of Current or Ex-Partner: Not on file  . Emotionally  Abused: Not on file  . Physically Abused: Not on file  . Sexually Abused: Not on file    Review of Systems  All other systems reviewed and are negative.   PHYSICAL EXAMINATION:    There were no vitals taken for this visit.    General appearance: alert, cooperative and appears stated age  Pelvic: External genitalia:  no lesions              Urethra:  normal appearing urethra with no masses, tenderness or lesions              Bartholins and Skenes: normal                 Vagina: normal appearing vagina with a slight increase in watery, brown vaginal d/c (cycle just ending)              Cervix: no lesions  Chaperone was present for exam.  ASSESSMENT H/O Trich H/o hematuria    PLAN Send nuswab for trich She will return for ccua, if dip + for blood, will send for micro ua.

## 2020-03-18 LAB — TRICHOMONAS VAGINALIS, PROBE AMP: Trich vag by NAA: NEGATIVE

## 2020-03-22 ENCOUNTER — Telehealth: Payer: Self-pay

## 2020-03-22 ENCOUNTER — Ambulatory Visit: Payer: Self-pay

## 2020-03-22 NOTE — Telephone Encounter (Signed)
Left message to call Arda Keadle, CMA. °

## 2020-03-22 NOTE — Telephone Encounter (Signed)
Patient called in regards to being on cycle and would like to know if it is okay to come in.

## 2020-03-22 NOTE — Telephone Encounter (Signed)
Spoke with patient and she is scheduled for repeat ccua today but she is still having her menses. Advised to reschedule appointment. Rescheduled nurse visit to 04-02-20 3:15 per patient's request. This a Friday afternoon and advised patient occasionally office closes early on Friday's and they may call and move appt. To morning.

## 2020-03-30 ENCOUNTER — Telehealth: Payer: Self-pay

## 2020-03-30 NOTE — Telephone Encounter (Signed)
Patient left message to cancel lab visit for repeat urine. Left message for patient to call back to reschedule.

## 2020-04-02 ENCOUNTER — Ambulatory Visit: Payer: Self-pay

## 2020-04-15 NOTE — Telephone Encounter (Signed)
Left message for pt to return call to triage RN. 

## 2020-04-19 NOTE — Telephone Encounter (Signed)
Left detailed message per DPR. Pt to return call to schedule repeat urine dip per Dr Talbert Nan. Pt advised can schedule with front office or triage.  Encounter closed

## 2020-05-12 ENCOUNTER — Other Ambulatory Visit: Payer: Self-pay

## 2020-05-12 ENCOUNTER — Encounter: Payer: Self-pay | Admitting: Obstetrics and Gynecology

## 2020-05-12 ENCOUNTER — Ambulatory Visit (INDEPENDENT_AMBULATORY_CARE_PROVIDER_SITE_OTHER): Payer: No Typology Code available for payment source | Admitting: Obstetrics and Gynecology

## 2020-05-12 VITALS — BP 158/90 | HR 80 | Ht 69.0 in | Wt 376.0 lb

## 2020-05-12 DIAGNOSIS — N760 Acute vaginitis: Secondary | ICD-10-CM

## 2020-05-12 DIAGNOSIS — B9689 Other specified bacterial agents as the cause of diseases classified elsewhere: Secondary | ICD-10-CM

## 2020-05-12 DIAGNOSIS — N3090 Cystitis, unspecified without hematuria: Secondary | ICD-10-CM | POA: Diagnosis not present

## 2020-05-12 MED ORDER — SULFAMETHOXAZOLE-TRIMETHOPRIM 800-160 MG PO TABS: 1.0000 | ORAL_TABLET | Freq: Two times a day (BID) | ORAL | 0 refills | Status: DC

## 2020-05-12 MED ORDER — PHENAZOPYRIDINE HCL 200 MG PO TABS: 200.0000 mg | ORAL_TABLET | Freq: Three times a day (TID) | ORAL | 0 refills | Status: DC | PRN

## 2020-05-12 MED ORDER — METRONIDAZOLE 0.75 % VA GEL: 1.0000 | Freq: Every day | VAGINAL | 0 refills | Status: DC

## 2020-05-12 NOTE — Progress Notes (Signed)
GYNECOLOGY  VISIT   HPI: 30 y.o.   Single Black or African American Not Hispanic or Latino  female   [id number] with Patient's last menstrual period was 04/26/2020.   here for UTI. Patient c/o urgency to urinate, frequency, pressure in bladder, and feeling as if she is not completely emptying. Per patient, contacted the fertility doctor two days ago and waiting for a call back, being referred to discuss myomectomy. She previously seen Dr Kerin Perna and he put her on medication and discussed weight loss prior to any surgery.  Bladder symptoms started a week ago and worsening. She has frequency, urgency, pressure/discomfort at the end of voiding. Nocturia x 5, small amounts.  No fever, she has some lower back pain. No increase in vaginal d/c but she has noticed an odor in the last week.  She is not using contraception. Wants to get pregnant, has been having unprotected intercourse for 4 years. She will f/u with Dr Kerin Perna. Since her visit in 10/21 her cycles have been more irregular.   GYNECOLOGIC HISTORY: Patient's last menstrual period was 04/26/2020. Contraception:none Menopausal hormone therapy: n/a        OB History    Gravida  2   Para  0   Term  0   Preterm  0   AB  2   Living  0     SAB  0   IAB  0   Ectopic  0   Multiple  0   Live Births  0              Patient Active Problem List   Diagnosis Date Noted  . Morbid obesity with BMI of 50.0-59.9, adult ([place]) 09/07/2017    Past Medical History:  Diagnosis Date  . Anemia   . Blood in stool   . Chicken pox   . GERD (gastroesophageal reflux disease)   . STD (sexually transmitted disease) 08/2016   Trich   . Urinary tract infection     Past Surgical History:  Procedure Laterality Date  . COLPOSCOPY    . gallstones      No current outpatient medications on file.   No current facility-administered medications for this visit.     ALLERGIES: Patient has no known allergies.  Family History  Problem  Relation Age of Onset  . Arthritis Mother   . Hypertension Mother   . Diabetes Mother   . Arthritis Father   . Heart disease Father   . Stroke Father   . Hypertension Father   . Diabetes Father   . Hypertension Maternal Grandmother   . Stroke Maternal Grandmother   . Heart disease Maternal Grandmother   . Hypertension Maternal Grandfather   . Stroke Maternal Grandfather   . Heart disease Maternal Grandfather   . Hypertension Paternal Grandmother   . Stroke Paternal Grandmother   . Heart disease Paternal Grandmother   . Hypertension Paternal Grandfather   . Stroke Paternal Grandfather   . Heart disease Paternal Grandfather     Social History   Socioeconomic History  . Marital status: Single    Spouse name: Not on file  . Number of children: Not on file  . Years of education: Not on file  . Highest education level: Not on file  Occupational History  . Not on file  Tobacco Use  . Smoking status: Never Smoker  . Smokeless tobacco: Never Used  Substance and Sexual Activity  . Alcohol use: Yes    Alcohol/week: 1.0 standard  drink    Types: 1 Standard drinks or equivalent per week  . Drug use: No  . Sexual activity: Yes    Partners: Male    Birth control/protection: I.U.D.    Comment: MIrena   Other Topics Concern  . Not on file  Social History Narrative  . Not on file   Social Determinants of Health   Financial Resource Strain: Not on file  Food Insecurity: Not on file  Transportation Needs: Not on file  Physical Activity: Not on file  Stress: Not on file  Social Connections: Not on file  Intimate Partner Violence: Not on file    Review of Systems  Constitutional: Negative.   HENT: Negative.   Eyes: Negative.   Respiratory: Negative.   Cardiovascular: Negative.   Gastrointestinal: Negative.   Genitourinary: Positive for frequency and urgency.       Pressure in bladder  Musculoskeletal: Negative.   Skin: Negative.   Neurological: Negative.    Endo/Heme/Allergies: Negative.   Psychiatric/Behavioral: Negative.     PHYSICAL EXAMINATION:    BP (!) 158/90 (BP Location: Left Arm, Patient Position: Sitting, Cuff Size: Large)   Pulse 80   Ht 5\' 9"  (1.753 m)   Wt (!) 376 lb (170.6 kg)   LMP 04/26/2020   BMI 55.53 kg/m     General appearance: alert, cooperative and appears stated age Abdomen: soft, uterus palpated up to her umbilicus, tender with palpation of her uterus. No other tenderness, non distended, no masses,  no organomegaly CVA: not tender  Urine micro: >60 WBC, >60 RBC, 10-20 squam epi, many bacteria. Moderate clue cells present.  (she has noticed a slight odor)  1. Cystitis  - Urinalysis,Complete w/RFL Culture - Urine Culture - REFLEXIVE URINE CULTURE - sulfamethoxazole-trimethoprim (BACTRIM DS) 800-160 MG tablet; Take 1 tablet by mouth 2 (two) times daily. One PO BID x 3 days  Dispense: 6 tablet; Refill: 0 - phenazopyridine (PYRIDIUM) 200 MG tablet; Take 1 tablet (200 mg total) by mouth 3 (three) times daily as needed for pain.  Dispense: 10 tablet; Refill: 0  2. Bacterial vaginitis C/o odor, clue cells seen on urinalysis. - metroNIDAZOLE (METROGEL) 0.75 % vaginal gel; Place 1 Applicatorful vaginally at bedtime. Use for 5 days  Dispense: 70 g; Refill: 0  She will f/u with Dr Kerin Perna for fibroids and fertility. Calendar her cycles, call with concerns (after see's Dr Darreld Mclean) She was previously on medication with Dr Darreld Mclean, will get records.

## 2020-05-12 NOTE — Patient Instructions (Signed)
Vaginitis  Vaginitis is a condition in which the vaginal tissue swells and becomes irritated. This condition is most often caused by a change in the normal balance of bacteria and yeast that live in the vagina. This change causes an overgrowth of certain bacteria or yeast, which causes the inflammation. There are different types of vaginitis. What are the causes? The cause of this condition depends on the type of vaginitis. It can be caused by:  Bacteria (bacterial vaginosis).  Yeast, which is a fungus (candidiasis).  A parasite (trichomoniasis vaginitis).  A virus (viral vaginitis).  Low hormone levels (atrophic vaginitis). Low hormone levels can occur during pregnancy, breastfeeding, or after menopause.  Irritants, such as bubble baths, scented tampons, and feminine sprays (allergic vaginitis). Other factors can change the normal balance of the yeast and bacteria that live in the vagina. These include:  Antibiotic medicines.  Poor hygiene.  Diaphragms, vaginal sponges, spermicides, birth control pills, and intrauterine devices (IUDs).  Sex.  Infection.  Uncontrolled diabetes.  A weakened body defense system (immune system). What increases the risk? This condition is more likely to develop in women who:  Smoke or are exposed to secondhand smoke.  Use vaginal douches, scented tampons, or scented sanitary pads.  Wear tight-fitting pants or thong underwear.  Use oral birth control pills or an IUD.  Have sex without a condom or have multiple partners.  Have an STI.  Frequently use the spermicide nonoxynol-9.  Eat lots of foods high in sugar or who have uncontrolled diabetes.  Have low estrogen levels.  Have a weakened immune system from an immune disorder or medical treatment.  Are pregnant or breastfeeding. What are the signs or symptoms? Symptoms vary depending on the cause of the vaginitis. Common symptoms include:  Abnormal vaginal discharge. ? The  discharge is white, gray, or yellow with bacterial vaginosis. ? The discharge is thick, white, and cheesy with a yeast infection. ? The discharge is frothy and yellow or greenish with trichomoniasis.  A bad vaginal smell. The smell is fishy with bacterial vaginosis.  Vaginal itching, pain, or swelling.  Pain with sex.  Pain or burning when urinating. Sometimes there are no symptoms. How is this diagnosed? This condition is diagnosed based on your symptoms and medical history. A physical exam, including a pelvic exam, will also be done. You may also have other tests, including:  Tests to determine the pH level (acidity or alkalinity) of your vagina.  A whiff test to assess the odor that results when a sample of your vaginal discharge is mixed with a potassium hydroxide solution.  Tests of vaginal fluid. A sample will be examined under a microscope. How is this treated? Treatment varies depending on the type of vaginitis you have. Your treatment may include:  Antibiotic creams or pills to treat bacterial vaginosis and trichomoniasis.  Antifungal medicines, such as vaginal creams or suppositories, to treat a yeast infection.  Medicine to ease discomfort if you have viral vaginitis. Your sexual partner should also be treated.  Estrogen delivered in a cream, pill, suppository, or vaginal ring to treat atrophic vaginitis. If vaginal dryness occurs, lubricants and moisturizing creams may help. You may need to avoid scented soaps, sprays, or douches.  Stopping use of a product that is causing allergic vaginitis and then using a vaginal cream to treat the symptoms. Follow these instructions at home: Lifestyle  Keep your genital area clean and dry. Avoid soap, and only rinse the area with water.  Do not douche  or use tampons until your health care provider says it is okay. Use sanitary pads, if needed.  Do not have sex until your health care provider approves. When you can return to sex,  practice safe sex and use condoms.  Wipe from front to back. This avoids the spread of bacteria from the rectum to the vagina. General instructions  Take over-the-counter and prescription medicines only as told by your health care provider.  If you were prescribed an antibiotic medicine, take or use it as told by your health care provider. Do not stop taking or using the antibiotic even if you start to feel better.  Keep all follow-up visits. This is important. How is this prevented?  Use mild, unscented products. Do not use things that can irritate the vagina, such as fabric softeners. Avoid the following products if they are scented: ? Feminine sprays. ? Detergents. ? Tampons. ? Feminine hygiene products. ? Soaps or bubble baths.  Let air reach your genital area. To do this: ? Wear cotton underwear to reduce moisture buildup. ? Avoid wearing underwear while you sleep. ? Avoid wearing tight pants and underwear or nylons without a cotton panel. ? Avoid wearing thong underwear.  Take off any wet clothing, such as bathing suits, as soon as possible.  Practice safe sex and use condoms. Contact a health care provider if:  You have abdominal or pelvic pain.  You have a fever or chills.  You have symptoms that last for more than 2-3 days. Get help right away if:  You have a fever and your symptoms suddenly get worse. Summary  Vaginitis is a condition in which the vaginal tissue becomes inflamed.This condition is most often caused by a change in the normal balance of bacteria and yeast that live in the vagina.  Treatment varies depending on the type of vaginitis you have.  Do not douche, use tampons, or have sex until your health care provider approves. When you can return to sex, practice safe sex and use condoms. This information is not intended to replace advice given to you by your health care provider. Make sure you discuss any questions you have with your health care  provider. Document Revised: 10/16/2019 Document Reviewed: 10/16/2019 Elsevier Patient Education  2021 [place]. Urinary Tract Infection, Adult  A urinary tract infection (UTI) is an infection of any part of the urinary tract. The urinary tract includes the kidneys, ureters, bladder, and urethra. These organs make, store, and get rid of urine in the body. An upper UTI affects the ureters and kidneys. A lower UTI affects the bladder and urethra. What are the causes? Most urinary tract infections are caused by bacteria in your genital area around your urethra, where urine leaves your body. These bacteria grow and cause inflammation of your urinary tract. What increases the risk? You are more likely to develop this condition if:  You have a urinary catheter that stays in place.  You are not able to control when you urinate or have a bowel movement (incontinence).  You are female and you: ? Use a spermicide or diaphragm for birth control. ? Have low estrogen levels. ? Are pregnant.  You have certain genes that increase your risk.  You are sexually active.  You take antibiotic medicines.  You have a condition that causes your flow of urine to slow down, such as: ? An enlarged prostate, if you are female. ? Blockage in your urethra. ? A kidney stone. ? A nerve condition that  affects your bladder control (neurogenic bladder). ? Not getting enough to drink, or not urinating often.  You have certain medical conditions, such as: ? Diabetes. ? A weak disease-fighting system (immunesystem). ? Sickle cell disease. ? Gout. ? Spinal cord injury. What are the signs or symptoms? Symptoms of this condition include:  Needing to urinate right away (urgency).  Frequent urination. This may include small amounts of urine each time you urinate.  Pain or burning with urination.  Blood in the urine.  Urine that smells bad or unusual.  Trouble urinating.  Cloudy urine.  Vaginal  discharge, if you are female.  Pain in the abdomen or the lower back. You may also have:  Vomiting or a decreased appetite.  Confusion.  Irritability or tiredness.  A fever or chills.  Diarrhea. The first symptom in older adults may be confusion. In some cases, they may not have any symptoms until the infection has worsened. How is this diagnosed? This condition is diagnosed based on your medical history and a physical exam. You may also have other tests, including:  Urine tests.  Blood tests.  Tests for STIs (sexually transmitted infections). If you have had more than one UTI, a cystoscopy or imaging studies may be done to determine the cause of the infections. How is this treated? Treatment for this condition includes:  Antibiotic medicine.  Over-the-counter medicines to treat discomfort.  Drinking enough water to stay hydrated. If you have frequent infections or have other conditions such as a kidney stone, you may need to see a health care provider who specializes in the urinary tract (urologist). In rare cases, urinary tract infections can cause sepsis. Sepsis is a life-threatening condition that occurs when the body responds to an infection. Sepsis is treated in the hospital with IV antibiotics, fluids, and other medicines. Follow these instructions at home: Medicines  Take over-the-counter and prescription medicines only as told by your health care provider.  If you were prescribed an antibiotic medicine, take it as told by your health care provider. Do not stop using the antibiotic even if you start to feel better. General instructions  Make sure you: ? Empty your bladder often and completely. Do not hold urine for long periods of time. ? Empty your bladder after sex. ? Wipe from front to back after urinating or having a bowel movement if you are female. Use each tissue only one time when you wipe.  Drink enough fluid to keep your urine pale yellow.  Keep all  follow-up visits. This is important.   Contact a health care provider if:  Your symptoms do not get better after 1-2 days.  Your symptoms go away and then return. Get help right away if:  You have severe pain in your back or your lower abdomen.  You have a fever or chills.  You have nausea or vomiting. Summary  A urinary tract infection (UTI) is an infection of any part of the urinary tract, which includes the kidneys, ureters, bladder, and urethra.  Most urinary tract infections are caused by bacteria in your genital area.  Treatment for this condition often includes antibiotic medicines.  If you were prescribed an antibiotic medicine, take it as told by your health care provider. Do not stop using the antibiotic even if you start to feel better.  Keep all follow-up visits. This is important. This information is not intended to replace advice given to you by your health care provider. Make sure you discuss any questions you have  with your health care provider. Document Revised: 11/28/2019 Document Reviewed: 11/28/2019 Elsevier Patient Education  [place].

## 2020-05-15 LAB — URINE CULTURE
MICRO NUMBER:: 11409496
SPECIMEN QUALITY:: ADEQUATE

## 2020-05-15 LAB — URINALYSIS, COMPLETE W/RFL CULTURE
Bilirubin Urine: NEGATIVE
Glucose, UA: NEGATIVE
Hyaline Cast: NONE SEEN /LPF
Ketones, ur: NEGATIVE
Nitrites, Initial: NEGATIVE
RBC / HPF: >=60 /HPF — AB (ref 0–2)
Specific Gravity, Urine: 1.025 (ref 1.001–1.03)
WBC, UA: >=60 /HPF — AB (ref 0–5)
pH: 7 (ref 5.0–8.0)

## 2020-05-15 LAB — CULTURE INDICATED

## 2020-05-16 ENCOUNTER — Other Ambulatory Visit: Payer: Self-pay | Admitting: Obstetrics and Gynecology

## 2020-05-16 MED ORDER — NITROFURANTOIN MONOHYD MACRO 100 MG PO CAPS: 100.0000 mg | ORAL_CAPSULE | Freq: Two times a day (BID) | ORAL | 0 refills | Status: DC

## 2020-07-30 ENCOUNTER — Telehealth: Payer: Self-pay | Admitting: *Deleted

## 2020-07-30 NOTE — Telephone Encounter (Signed)
Patient called reports Dr.Yalcinkaya started her on Orlissa tablet to help with fibroids until July and then she will have a follow up visit with him, to see if the fibroids are smaller to remove. Patient wanted to know what birth control options she has while taking Orlissa? I did recommend she schedule office visit to discuss, however she was hoping you would just sent Rx. Please advise

## 2020-08-02 NOTE — Telephone Encounter (Signed)
She needs to use a non hormonal contraception method while on Orilissa, hormonal contraception can interact with the [place].She should not get pregnant while taking the [place].  I would recommend she use condoms. Other option would be an IUD

## 2020-08-02 NOTE — Telephone Encounter (Signed)
Patient informed with below note. She will think about options and call back

## 2021-02-24 ENCOUNTER — Other Ambulatory Visit: Payer: Self-pay | Admitting: Obstetrics and Gynecology

## 2021-03-09 ENCOUNTER — Other Ambulatory Visit (HOSPITAL_COMMUNITY): Payer: Self-pay

## 2021-03-22 SURGERY — Surgical Case
Anesthesia: *Unknown

## 2021-03-31 ENCOUNTER — Ambulatory Visit (INDEPENDENT_AMBULATORY_CARE_PROVIDER_SITE_OTHER): Payer: No Typology Code available for payment source | Admitting: Obstetrics and Gynecology

## 2021-03-31 ENCOUNTER — Encounter: Payer: Self-pay | Admitting: Obstetrics and Gynecology

## 2021-03-31 ENCOUNTER — Other Ambulatory Visit: Payer: Self-pay

## 2021-03-31 VITALS — BP 132/84 | HR 72 | Ht 69.0 in | Wt 364.0 lb

## 2021-03-31 DIAGNOSIS — A5901 Trichomonal vulvovaginitis: Secondary | ICD-10-CM

## 2021-03-31 DIAGNOSIS — N3090 Cystitis, unspecified without hematuria: Secondary | ICD-10-CM

## 2021-03-31 DIAGNOSIS — R300 Dysuria: Secondary | ICD-10-CM | POA: Diagnosis not present

## 2021-03-31 DIAGNOSIS — N760 Acute vaginitis: Secondary | ICD-10-CM

## 2021-03-31 DIAGNOSIS — B9689 Other specified bacterial agents as the cause of diseases classified elsewhere: Secondary | ICD-10-CM

## 2021-03-31 DIAGNOSIS — Z113 Encounter for screening for infections with a predominantly sexual mode of transmission: Secondary | ICD-10-CM

## 2021-03-31 LAB — WET PREP FOR TRICH, YEAST, CLUE

## 2021-03-31 MED ORDER — PHENAZOPYRIDINE HCL 200 MG PO TABS: 200.0000 mg | ORAL_TABLET | Freq: Three times a day (TID) | ORAL | 0 refills | Status: DC | PRN

## 2021-03-31 MED ORDER — METRONIDAZOLE 500 MG PO TABS: 500.0000 mg | ORAL_TABLET | Freq: Two times a day (BID) | ORAL | 0 refills | Status: DC

## 2021-03-31 MED ORDER — SULFAMETHOXAZOLE-TRIMETHOPRIM 800-160 MG PO TABS: 1.0000 | ORAL_TABLET | Freq: Two times a day (BID) | ORAL | 0 refills | Status: DC

## 2021-03-31 MED ORDER — PHENAZOPYRIDINE HCL 200 MG PO TABS: 200.0000 mg | ORAL_TABLET | Freq: Three times a day (TID) | ORAL | 0 refills | Status: DC

## 2021-03-31 NOTE — Progress Notes (Signed)
GYNECOLOGY  VISIT   HPI: 30 y.o.   Married Black or [place] American Not Hispanic or Latino  female   [phone] with Patient's last menstrual period was 03/01/2021 (approximate).   here for dysuria. Patient states that about a week ago she took some pyridium but is still having pain with urination. She is scheduled for removal or ablation of her fibroids on 04/13/21 with [facility].  She had trich in 10/21, negative in 11/21. She and her husband were both treated, she states he has nausea and emesis with the medication and isn't sure he absorbed it.  She c/o a 1 week h/o dysuria. No change in urinary frequency or urgency.  She has noticed a vaginal odor, no increase in d/c, no itching, burning or irritation. No abdominal pain or flank pain.  GYNECOLOGIC HISTORY: Patient's last menstrual period was 03/01/2021 (approximate). Contraception:none  Menopausal hormone therapy: none         OB History     Gravida  2   Para  0   Term  0   Preterm  0   AB  2   Living  0      SAB  0   IAB  0   Ectopic  0   Multiple  0   Live Births  0              Patient Active Problem List   Diagnosis Date Noted   Morbid obesity with BMI of 50.0-59.9, adult ([place]) 09/07/2017    Past Medical History:  Diagnosis Date   Anemia    Blood in stool    Chicken pox    GERD (gastroesophageal reflux disease)    STD (sexually transmitted disease) 08/2016   Trich    Urinary tract infection     Past Surgical History:  Procedure Laterality Date   COLPOSCOPY     gallstones      Current Outpatient Medications  Medication Sig Dispense Refill   phenazopyridine (PYRIDIUM) 200 MG tablet Take 1 tablet (200 mg total) by mouth 3 (three) times daily as needed for pain. 10 tablet 0   No current facility-administered medications for this visit.     ALLERGIES: Patient has no known allergies.  Family History  Problem Relation Age of Onset   Arthritis Mother    Hypertension Mother     Diabetes Mother    Arthritis Father    Heart disease Father    Stroke Father    Hypertension Father    Diabetes Father    Hypertension Maternal Grandmother    Stroke Maternal Grandmother    Heart disease Maternal Grandmother    Hypertension Maternal Grandfather    Stroke Maternal Grandfather    Heart disease Maternal Grandfather    Hypertension Paternal Grandmother    Stroke Paternal Grandmother    Heart disease Paternal Grandmother    Hypertension Paternal Grandfather    Stroke Paternal Grandfather    Heart disease Paternal Grandfather     Social History   Socioeconomic History   Marital status: Single    Spouse name: Not on file   Number of children: Not on file   Years of education: Not on file   Highest education level: Not on file  Occupational History   Not on file  Tobacco Use   Smoking status: Never   Smokeless tobacco: Never  Substance and Sexual Activity   Alcohol use: Yes    Alcohol/week: 1.0 standard drink    Types: 1 Standard  drinks or equivalent per week   Drug use: No   Sexual activity: Yes    Partners: Male    Birth control/protection: I.U.D.    Comment: MIrena   Other Topics Concern   Not on file  Social History Narrative   Not on file   Social Determinants of Health   Financial Resource Strain: Not on file  Food Insecurity: Not on file  Transportation Needs: Not on file  Physical Activity: Not on file  Stress: Not on file  Social Connections: Not on file  Intimate Partner Violence: Not on file    ROS  PHYSICAL EXAMINATION:    BP 132/84   Pulse 72   Ht 5\' 9"  (1.753 m)   Wt (!) 364 lb (165.1 kg)   LMP 03/01/2021 (Approximate)   SpO2 98%   BMI 53.75 kg/m     General appearance: alert, cooperative and appears stated age   Pelvic: External genitalia:  no lesions              Urethra:  normal appearing urethra with no masses, tenderness or lesions              Bartholins and Skenes: normal                 Vagina: normal appearing  vagina with normal color and discharge, no lesions              Cervix: no lesions               Chaperone was present for exam.  1. Dysuria - Urinalysis,Complete w/RFL Culture  2. Trichomonal vaginitis - WET PREP FOR TRICH, YEAST, CLUE - metroNIDAZOLE (FLAGYL) 500 MG tablet; Take 1 tablet (500 mg total) by mouth 2 (two) times daily.  Dispense: 14 tablet; Refill: 0 - phenazopyridine (PYRIDIUM) 200 MG tablet; Take 1 tablet (200 mg total) by mouth 3 (three) times daily with meals.  Dispense: 6 tablet; Refill: 0 -She is supposed to have surgery in 2 weeks, she will discuss with Dr Kerin Perna -F/U testing in 1 month -husband needs testing and treatment with his primary  3. Screening examination for STD (sexually transmitted disease) - SURESWAB CT/NG/T. vaginalis - RPR - HIV Antibody (routine testing w rflx) - Hepatitis C antibody  4. BV (bacterial vaginosis) - metroNIDAZOLE (FLAGYL) 500 MG tablet; Take 1 tablet (500 mg total) by mouth 2 (two) times daily.  Dispense: 14 tablet; Refill: 0 - phenazopyridine (PYRIDIUM) 200 MG tablet; Take 1 tablet (200 mg total) by mouth 3 (three) times daily with meals.  Dispense: 6 tablet; Refill: 0  5. Cystitis - sulfamethoxazole-trimethoprim (BACTRIM DS) 800-160 MG tablet; Take 1 tablet by mouth 2 (two) times daily. One PO BID x 3 days  Dispense: 6 tablet; Refill: 0 - phenazopyridine (PYRIDIUM) 200 MG tablet; Take 1 tablet (200 mg total) by mouth 3 (three) times daily as needed for pain.  Dispense: 10 tablet; Refill: 0   CC; Dr Kerin Perna

## 2021-03-31 NOTE — Patient Instructions (Signed)

## 2021-04-01 LAB — HIV ANTIBODY (ROUTINE TESTING W REFLEX): HIV 1&2 Ab, 4th Generation: NONREACTIVE

## 2021-04-01 LAB — HEPATITIS C ANTIBODY
Hepatitis C Ab: NONREACTIVE
SIGNAL TO CUT-OFF: 0.07 (ref <1.00)

## 2021-04-01 LAB — RPR: RPR Ser Ql: NONREACTIVE

## 2021-04-02 LAB — URINALYSIS, COMPLETE W/RFL CULTURE
Bilirubin Urine: NEGATIVE
Casts: NONE SEEN /LPF
Crystals: NONE SEEN /HPF
Glucose, UA: NEGATIVE
Hyaline Cast: NONE SEEN /LPF
Ketones, ur: NEGATIVE
Nitrites, Initial: NEGATIVE
Specific Gravity, Urine: 1.02 (ref 1.001–1.035)
Yeast: NONE SEEN /HPF
pH: 7 (ref 5.0–8.0)

## 2021-04-02 LAB — URINE CULTURE
MICRO NUMBER:: 12701373
Result:: NO GROWTH
SPECIMEN QUALITY:: ADEQUATE

## 2021-04-02 LAB — SURESWAB CT/NG/T. VAGINALIS
C. trachomatis RNA, TMA: NOT DETECTED
N. gonorrhoeae RNA, TMA: NOT DETECTED
Trichomonas vaginalis RNA: DETECTED — AB

## 2021-04-02 LAB — CULTURE INDICATED

## 2021-04-05 NOTE — Progress Notes (Signed)
Linda Ochoa  04/05/2021   Your procedure is scheduled on:        04/13/2021   Report to [facility] Main  Entrance   Report to admitting at1100 AM     Call this number if you have problems the morning of surgery [phone]    Remember: Do not eat food , candy gum or mints :After Midnight. You may have clear liquids from midnight until __ 1015 am    CLEAR LIQUID DIET   Foods Allowed                                                                       Coffee and tea, regular and decaf                              Plain Jell-O any favor except red or purple                                            Fruit ices (not with fruit pulp)                                      Iced Popsicles                                     Carbonated beverages, regular and diet                                    Cranberry, grape and apple juices Sports drinks like Gatorade Lightly seasoned clear broth or consume(fat free) Sugar   _____________________________________________________________________    BRUSH YOUR TEETH MORNING OF SURGERY AND RINSE YOUR MOUTH OUT, NO CHEWING GUM CANDY OR MINTS.     Take these medicines the morning of surgery with A SIP OF WATER:  none   DO NOT TAKE ANY DIABETIC MEDICATIONS DAY OF YOUR SURGERY                               You may not have any metal on your body including hair pins and              piercings  Do not wear jewelry, make-up, lotions, powders or perfumes, deodorant             Do not wear nail polish on your fingernails.  Do not shave  48 hours prior to surgery.              Men may shave face and neck.   Do not bring valuables to the hospital. [place].  Contacts, dentures or bridgework may not be worn into surgery.  Leave suitcase in the car. After surgery it may be brought to your room.     Patients discharged the day of surgery will not be allowed to drive  home. IF YOU ARE HAVING SURGERY AND GOING HOME THE SAME DAY, YOU MUST HAVE AN ADULT TO DRIVE YOU HOME AND BE WITH YOU FOR 24 HOURS. YOU MAY GO HOME BY TAXI OR UBER OR ORTHERWISE, BUT AN ADULT MUST ACCOMPANY YOU HOME AND STAY WITH YOU FOR 24 HOURS.  Name and phone number of your driver:  Special Instructions: N/A              Please read over the following fact sheets you were given: _____________________________________________________________________  [facility] - Preparing for Surgery Before surgery, you can play an important role.  Because skin is not sterile, your skin needs to be as free of germs as possible.  You can reduce the number of germs on your skin by washing with CHG (chlorahexidine gluconate) soap before surgery.  CHG is an antiseptic cleaner which kills germs and bonds with the skin to continue killing germs even after washing. Please DO NOT use if you have an allergy to CHG or antibacterial soaps.  If your skin becomes reddened/irritated stop using the CHG and inform your nurse when you arrive at Short Stay. Do not shave (including legs and underarms) for at least 48 hours prior to the first CHG shower.  You may shave your face/neck. Please follow these instructions carefully:  1.  Shower with CHG Soap the night before surgery and the  morning of Surgery.  2.  If you choose to wash your hair, wash your hair first as usual with your  normal  shampoo.  3.  After you shampoo, rinse your hair and body thoroughly to remove the  shampoo.                           4.  Use CHG as you would any other liquid soap.  You can apply chg directly  to the skin and wash                       Gently with a scrungie or clean washcloth.  5.  Apply the CHG Soap to your body ONLY FROM THE NECK DOWN.   Do not use on face/ open                           Wound or open sores. Avoid contact with eyes, ears mouth and genitals (private parts).                       Wash face,  Genitals (private parts) with  your normal soap.             6.  Wash thoroughly, paying special attention to the area where your surgery  will be performed.  7.  Thoroughly rinse your body with warm water from the neck down.  8.  DO NOT shower/wash with your normal soap after using and rinsing off  the CHG Soap.                9.  Pat yourself dry with a clean towel.            10.  Wear clean pajamas.  11.  Place clean sheets on your bed the night of your first shower and do not  sleep with pets. Day of Surgery : Do not apply any lotions/deodorants the morning of surgery.  Please wear clean clothes to the hospital/surgery center.  FAILURE TO FOLLOW THESE INSTRUCTIONS MAY RESULT IN THE CANCELLATION OF YOUR SURGERY PATIENT SIGNATURE_________________________________  NURSE SIGNATURE__________________________________  ________________________________________________________________________

## 2021-04-05 NOTE — Progress Notes (Addendum)
Anesthesia Review:  PCP: DR Grier Mitts, DR Talbert Nan per pt handles medical issues.  NOt seen by PCP in 2 years.  Cardiologist : none  Chest x-ray : EKG :04/06/21  Echo : Stress test: Cardiac Cath :  Activity level:  Sleep Study/ CPAP : Fasting Blood Sugar :      / Checks Blood Sugar -- times a day:   Blood Thinner/ Instructions /Last Dose: ASA / Instructions/ Last Dose :   At preop appt blood pressure in right lower arm was 153/114.  Left lower arm was 149/104 and left upper arm was 162/100.  PT denies any hx of hypertension.  PT denies any chest pain, headache, shortness of breath or dizziness.   CBc, BMP, and ekg done at preop appt.  Informed pt to be in contact with DR Talbert Nan who handles medical issues.  PT not seen by PCP in 2 years.  PT voiced understanding.  Blood pressures readings written down for pt and given to her.   CBC done 04/06/21 routed to Dr Kerin Perna.

## 2021-04-06 ENCOUNTER — Encounter (HOSPITAL_COMMUNITY)
Admission: RE | Admit: 2021-04-06 | Discharge: 2021-04-06 | Disposition: A | Discharge status: Home / Self Care | Payer: No Typology Code available for payment source | Source: Ambulatory Visit | Attending: Obstetrics and Gynecology | Admitting: Obstetrics and Gynecology

## 2021-04-06 ENCOUNTER — Encounter (HOSPITAL_COMMUNITY): Payer: Self-pay

## 2021-04-06 ENCOUNTER — Other Ambulatory Visit: Payer: Self-pay

## 2021-04-06 VITALS — BP 162/100 | HR 72 | Temp 98.7°F | Resp 16 | Ht 69.0 in | Wt 358.0 lb

## 2021-04-06 DIAGNOSIS — Z6841 Body Mass Index (BMI) 40.0 and over, adult: Secondary | ICD-10-CM | POA: Diagnosis not present

## 2021-04-06 DIAGNOSIS — Z01818 Encounter for other preprocedural examination: Secondary | ICD-10-CM | POA: Diagnosis present

## 2021-04-06 LAB — CBC
HCT: 31.6 % — ABNORMAL LOW (ref 36.0–46.0)
Hemoglobin: 9.5 g/dL — ABNORMAL LOW (ref 12.0–15.0)
MCH: 25.7 pg — ABNORMAL LOW (ref 26.0–34.0)
MCHC: 30.1 g/dL (ref 30.0–36.0)
MCV: 85.4 fL (ref 80.0–100.0)
Platelets: 371 10*3/uL (ref 150–400)
RBC: 3.7 MIL/uL — ABNORMAL LOW (ref 3.87–5.11)
RDW: 15.1 % (ref 11.5–15.5)
WBC: 7.7 10*3/uL (ref 4.0–10.5)
nRBC: 0 % (ref 0.0–0.2)

## 2021-04-06 LAB — BASIC METABOLIC PANEL
Anion gap: 5 (ref 5–15)
BUN: 13 mg/dL (ref 6–20)
CO2: 25 mmol/L (ref 22–32)
Calcium: 9 mg/dL (ref 8.9–10.3)
Chloride: 105 mmol/L (ref 98–111)
Creatinine, Ser: 0.84 mg/dL (ref 0.44–1.00)
GFR, Estimated: >60 mL/min (ref >60)
Glucose, Bld: 99 mg/dL (ref 70–99)
Potassium: 4.3 mmol/L (ref 3.5–5.1)
Sodium: 135 mmol/L (ref 135–145)

## 2021-04-07 LAB — RPR: RPR Ser Ql: NONREACTIVE

## 2021-04-08 NOTE — H&P (Signed)
CC: Pre-op  HPI: 30 year old scheduled for Acessa (although may need conversion to L/S Gelport Assisted Myomectomy) on 04/13/21.     Fertility plan: Patient desires to TTC on her own in 1 year.  PMH: Morbid Obesity, Trichomoniasis  PSH: Gallstone removal     SOCIAL HX: Smokes marijuana every other day, occasional alcohol. Denies tobacco use.  MEDICATIONS: Flagyl, Phenazopyridine hydrochloride      ALLERGIES: NKDA  PHYSICAL EXAM: Constitutional: Morbidly Obese, Well-developed, well-nourished female in no acute distress. Neurological: Alert and oriented . Psychiatric: Mood and affect appropriate. Skin: No rashes or lesions. Respiratory: Good air movement with normal work of breathing. Cardiovascular: Regular rate and rhythm. Extremities grossly normal Gastrointestinal: Soft, nontender, nondistended. Rounded firm mass noted 1 finger breath above umbilicus. Appropriate bowel sounds. No hernias appreciated. No hepatosplenomegaly.   TRANSVAGINAL ULTRASOUND performed on 11/22/20 1. Fundal SS myoma measuring 9.8 x 7.3 x 8.7 cm 2. Posterior LUS SS measuring 4.8 x 4.5 x 5.3 cm 3. Anterior R IM measuring 4.4 x 3.3 x 4.3 cm    ASSESSMENT:  30 year old scheduled for Acessa (although may need conversion to L/S Gelport Assisted Myomectomy) on 04/13/21.        MANAGEMENT/COUNSULING: * Pre-op counseling:  Discussed risks, benefits, and alternatives to the procedure.  Discussed risk of injury to surrounding organs, bleeding, infection, and creation of scar tissue.   Patient accepts risks and a consent will be signed on the day of surgery. Reviewed Bowel prep instructions and handout given. *Patient advised do not smoke marijuana. *If converted to Gelport myomectomy, pt understands future deliveries will need a C/S. The visit was 15 min long with > half spent FTF counseling.

## 2021-04-13 ENCOUNTER — Ambulatory Visit (HOSPITAL_COMMUNITY)
Admission: RE | Admit: 2021-04-13 | Discharge: 2021-04-13 | Disposition: A | Discharge status: Home / Self Care | Payer: No Typology Code available for payment source | Source: Ambulatory Visit | Attending: Obstetrics and Gynecology | Admitting: Obstetrics and Gynecology

## 2021-04-13 ENCOUNTER — Ambulatory Visit (HOSPITAL_COMMUNITY): Payer: No Typology Code available for payment source | Admitting: Physician Assistant

## 2021-04-13 ENCOUNTER — Ambulatory Visit (HOSPITAL_COMMUNITY): Payer: No Typology Code available for payment source | Admitting: Anesthesiology

## 2021-04-13 ENCOUNTER — Encounter (HOSPITAL_COMMUNITY): Payer: Self-pay | Admitting: Obstetrics and Gynecology

## 2021-04-13 ENCOUNTER — Encounter (HOSPITAL_COMMUNITY)
Admission: RE | Disposition: A | Discharge status: Home / Self Care | Payer: Self-pay | Source: Ambulatory Visit | Attending: Obstetrics and Gynecology

## 2021-04-13 DIAGNOSIS — N920 Excessive and frequent menstruation with regular cycle: Secondary | ICD-10-CM | POA: Insufficient documentation

## 2021-04-13 DIAGNOSIS — F17200 Nicotine dependence, unspecified, uncomplicated: Secondary | ICD-10-CM | POA: Insufficient documentation

## 2021-04-13 DIAGNOSIS — D252 Subserosal leiomyoma of uterus: Secondary | ICD-10-CM | POA: Insufficient documentation

## 2021-04-13 DIAGNOSIS — D259 Leiomyoma of uterus, unspecified: Secondary | ICD-10-CM | POA: Diagnosis present

## 2021-04-13 DIAGNOSIS — D649 Anemia, unspecified: Secondary | ICD-10-CM | POA: Insufficient documentation

## 2021-04-13 DIAGNOSIS — D251 Intramural leiomyoma of uterus: Secondary | ICD-10-CM | POA: Diagnosis not present

## 2021-04-13 HISTORY — PX: MYOMECTOMY: SHX85

## 2021-04-13 LAB — TYPE AND SCREEN
ABO/RH(D): O POS
Antibody Screen: NEGATIVE

## 2021-04-13 LAB — ABO/RH: ABO/RH(D): O POS

## 2021-04-13 LAB — PREGNANCY, URINE: Preg Test, Ur: NEGATIVE

## 2021-04-13 SURGERY — RADIOFREQUENCY ABLATION, LEIOMYOMA, UTERUS, TRANSCERVICAL APPROACH, WITH US GUIDANCE
Anesthesia: General | Site: Vagina

## 2021-04-13 MED ORDER — OXYCODONE HCL 5 MG PO TABS: 5.0000 mg | ORAL_TABLET | Freq: Once | ORAL | Status: DC | PRN

## 2021-04-13 MED ORDER — ACETAMINOPHEN 500 MG PO TABS
1000.0000 mg | ORAL_TABLET | Freq: Once | ORAL | Status: AC
  Administered 2021-04-13: 12:00:00 1000 mg via ORAL
  Filled 2021-04-13: qty 2

## 2021-04-13 MED ORDER — BUPIVACAINE HCL (PF) 0.25 % IJ SOLN
INTRAMUSCULAR | Status: AC
  Filled 2021-04-13: qty 30

## 2021-04-13 MED ORDER — STERILE WATER FOR IRRIGATION IR SOLN
Status: DC | PRN
  Administered 2021-04-13: 1000 mL

## 2021-04-13 MED ORDER — LIDOCAINE HCL (PF) 2 % IJ SOLN
INTRAMUSCULAR | Status: DC | PRN
  Administered 2021-04-13: 100 mg via INTRADERMAL

## 2021-04-13 MED ORDER — POVIDONE-IODINE 10 % EX SWAB: 2.0000 "application " | Freq: Once | CUTANEOUS | Status: DC

## 2021-04-13 MED ORDER — PROPOFOL 10 MG/ML IV BOLUS
INTRAVENOUS | Status: AC
  Filled 2021-04-13: qty 20

## 2021-04-13 MED ORDER — KETAMINE HCL 10 MG/ML IJ SOLN
INTRAMUSCULAR | Status: AC
  Filled 2021-04-13: qty 1

## 2021-04-13 MED ORDER — ONDANSETRON HCL 4 MG/2ML IJ SOLN
INTRAMUSCULAR | Status: AC
  Filled 2021-04-13: qty 2

## 2021-04-13 MED ORDER — FENTANYL CITRATE PF 50 MCG/ML IJ SOSY: 25.0000 ug | PREFILLED_SYRINGE | INTRAMUSCULAR | Status: DC | PRN

## 2021-04-13 MED ORDER — ROCURONIUM BROMIDE 10 MG/ML (PF) SYRINGE
PREFILLED_SYRINGE | INTRAVENOUS | Status: DC | PRN
  Administered 2021-04-13: 60 mg via INTRAVENOUS
  Administered 2021-04-13: 10 mg via INTRAVENOUS

## 2021-04-13 MED ORDER — CEFAZOLIN IN SODIUM CHLORIDE 3-0.9 GM/100ML-% IV SOLN
3.0000 g | Freq: Once | INTRAVENOUS | Status: AC
  Administered 2021-04-13: 15:00:00 3 g via INTRAVENOUS

## 2021-04-13 MED ORDER — DEXAMETHASONE SODIUM PHOSPHATE 10 MG/ML IJ SOLN
INTRAMUSCULAR | Status: DC | PRN
Start: 2021-04-13 — End: 2021-04-13
  Administered 2021-04-13: 10 mg via INTRAVENOUS

## 2021-04-13 MED ORDER — AMISULPRIDE (ANTIEMETIC) 5 MG/2ML IV SOLN: 10.0000 mg | Freq: Once | INTRAVENOUS | Status: DC | PRN

## 2021-04-13 MED ORDER — ROCURONIUM BROMIDE 10 MG/ML (PF) SYRINGE
PREFILLED_SYRINGE | INTRAVENOUS | Status: AC
  Filled 2021-04-13: qty 10

## 2021-04-13 MED ORDER — FENTANYL CITRATE (PF) 100 MCG/2ML IJ SOLN
INTRAMUSCULAR | Status: AC
  Filled 2021-04-13: qty 2

## 2021-04-13 MED ORDER — LIDOCAINE HCL (PF) 2 % IJ SOLN
INTRAMUSCULAR | Status: AC
  Filled 2021-04-13: qty 5

## 2021-04-13 MED ORDER — LACTATED RINGERS IV SOLN: INTRAVENOUS | Status: DC

## 2021-04-13 MED ORDER — FENTANYL CITRATE (PF) 100 MCG/2ML IJ SOLN
INTRAMUSCULAR | Status: DC | PRN
  Administered 2021-04-13 (×3): 50 ug via INTRAVENOUS

## 2021-04-13 MED ORDER — ONDANSETRON HCL 4 MG/2ML IJ SOLN: 4.0000 mg | Freq: Once | INTRAMUSCULAR | Status: DC | PRN

## 2021-04-13 MED ORDER — SUCCINYLCHOLINE CHLORIDE 200 MG/10ML IV SOSY
PREFILLED_SYRINGE | INTRAVENOUS | Status: AC
  Filled 2021-04-13: qty 10

## 2021-04-13 MED ORDER — PROPOFOL 10 MG/ML IV BOLUS
INTRAVENOUS | Status: DC | PRN
  Administered 2021-04-13: 200 mg via INTRAVENOUS

## 2021-04-13 MED ORDER — TRAMADOL HCL 50 MG PO TABS: 50.0000 mg | ORAL_TABLET | Freq: Four times a day (QID) | ORAL | 0 refills | Status: DC | PRN

## 2021-04-13 MED ORDER — ONDANSETRON HCL 4 MG/2ML IJ SOLN
INTRAMUSCULAR | Status: DC | PRN
  Administered 2021-04-13: 4 mg via INTRAVENOUS

## 2021-04-13 MED ORDER — KETAMINE HCL 10 MG/ML IJ SOLN
INTRAMUSCULAR | Status: DC | PRN
  Administered 2021-04-13: 50 mg via INTRAVENOUS

## 2021-04-13 MED ORDER — MIDAZOLAM HCL 2 MG/2ML IJ SOLN
INTRAMUSCULAR | Status: AC
  Filled 2021-04-13: qty 2

## 2021-04-13 MED ORDER — KETOROLAC TROMETHAMINE 30 MG/ML IJ SOLN
INTRAMUSCULAR | Status: DC | PRN
  Administered 2021-04-13: 30 mg via INTRAVENOUS

## 2021-04-13 MED ORDER — OXYCODONE HCL 5 MG/5ML PO SOLN: 5.0000 mg | Freq: Once | ORAL | Status: DC | PRN

## 2021-04-13 MED ORDER — DEXAMETHASONE SODIUM PHOSPHATE 10 MG/ML IJ SOLN
INTRAMUSCULAR | Status: AC
  Filled 2021-04-13: qty 1

## 2021-04-13 MED ORDER — CEFAZOLIN IN SODIUM CHLORIDE 3-0.9 GM/100ML-% IV SOLN
INTRAVENOUS | Status: AC
  Filled 2021-04-13: qty 100

## 2021-04-13 MED ORDER — MIDAZOLAM HCL 5 MG/5ML IJ SOLN
INTRAMUSCULAR | Status: DC | PRN
Start: 2021-04-13 — End: 2021-04-13
  Administered 2021-04-13: 2 mg via INTRAVENOUS

## 2021-04-13 MED ORDER — SUGAMMADEX SODIUM 200 MG/2ML IV SOLN
INTRAVENOUS | Status: DC | PRN
  Administered 2021-04-13: 400 mg via INTRAVENOUS

## 2021-04-13 SURGICAL SUPPLY — 8 items
COVER SURGICAL LIGHT HANDLE (MISCELLANEOUS) ×1 IMPLANT
GAUZE 4X4 16PLY ~~LOC~~+RFID DBL (SPONGE) ×1 IMPLANT
GLOVE SURG LTX SZ8 (GLOVE) ×1 IMPLANT
GOWN STRL REUS W/ TWL LRG LVL3 (GOWN DISPOSABLE) IMPLANT
GOWN STRL REUS W/TWL LRG LVL3 (GOWN DISPOSABLE) ×1
HANDPIECE RFA SONATA (MISCELLANEOUS) ×1 IMPLANT
PACK VAGINAL WOMENS (CUSTOM PROCEDURE TRAY) ×1 IMPLANT
PAD POSITIONING PINK XL (MISCELLANEOUS) ×1 IMPLANT

## 2021-04-13 NOTE — Op Note (Signed)
OPERATIVE NOTE  Preoperative diagnosis: Multiple uterine myomas, menorrhagia, morbid obesity  Postoperative diagnosis: Multiple uterine myomas, menorrhagia, morbid obesity  Procedure: Transcervical radiofrequency ablation (Sonata) of uterine myomas under ultrasound guidance  Surgeon: Governor Specking  Anesthesia: General  Complications: None  Estimated blood loss: Less than 20 mL  Specimen: None  Findings: On exam under anesthesia, pelvic mass was palpable in the abdomen, rising to 15-16 gestational week size.  It was firm, nontender and mobile. External genitalia, Bartholin's, Skene's, urethra and vagina were normal.  The cervix appeared grossly normal.  The uterus sounded to 11 cm.  Transcervical endoluminal (endometrial) reevaluation of the uterine walls showed a 4 cm anterior type III intramural myoma, a 6 cm posterior type II-V intramural myoma and anterior 8-9 cm subserosal myoma was also visualized but this was not ablated due to the absence of a safe access, using the transcervical ablation device.  Description of procedure: After adequate induction of anesthesia and administration of 3 g of cefazolin intravenously for prophylaxis the patient was placed in lithotomy position and the vagina and perineum were prepped and the patient was draped in a sterile manner the patient was identified and a timeout was performed and the procedure was confirmed.  Exam under anesthesia revealed above findings.  A speculum was placed and the cervix was grasped with a tenaculum and sounded easily to a depth of 11 cm.  The cervix was then serially dilated to 78 [place] with [employer] dilators.  The sonata handpiece was then inserted and a complete uterine survey was performed with the ultrasound.   Sonata fibroid ablation was then performed:   Anterior type III myoma (3 to 4 cm) a zone of 3.1 x 2.2 cm for a duration of 3 minutes 6 seconds plus a second zone of 3.7 x 3.0 cm for a duration of 4 minutes 36  seconds, Posterior type II-V myoma measuring 6 cm for a duration of 5 minutes 36 seconds (the zone that was covered measured 4.7 x 3.4 cm), Right-sided type IV intramural/subserosal myoma measuring 2.5 cm for a duration of 1 minute 12 seconds (zone measurement 2.0 x 1.4 cm).  All cycles were carried out under direct ultrasound intrauterine guidance and visualization with ablation guide noted to be within the serosa at all times.  The fibroids treated appeared ablated with ultrasound guidance without gassing noted by ultrasound appearance.  The endometrium was spared throughout the procedure.  The sponge needle instrument count was correct x2 all instruments were removed from the vagina. Estimated blood loss was less than 20 mL. The patient tolerated the procedure well and was transferred to recovery in satisfactory condition.   Governor Specking, MD

## 2021-04-13 NOTE — Anesthesia Postprocedure Evaluation (Signed)
Anesthesia Post Note  Patient: Linda Ochoa  Procedure(s) Performed: Radio Frequency Ablation with Sonata (Vagina )     Patient location during evaluation: PACU Anesthesia Type: General Level of consciousness: awake and alert and oriented Pain management: pain level controlled Vital Signs Assessment: post-procedure vital signs reviewed and stable Respiratory status: spontaneous breathing, nonlabored ventilation and respiratory function stable Cardiovascular status: blood pressure returned to baseline and stable Postop Assessment: no apparent nausea or vomiting Anesthetic complications: no   No notable events documented.  Last Vitals:  Vitals:   04/13/21 1700 04/13/21 1715  BP: (!) 155/79   Pulse: 83 74  Resp: 19 20  Temp:    SpO2: 99% 98%    Last Pain:  Vitals:   04/13/21 1700  TempSrc:   PainSc: 0-No pain                 Karstyn Birkey A.

## 2021-04-13 NOTE — Interval H&P Note (Signed)
History and Physical Interval Note:  04/13/2021 2:37 PM  Linda Ochoa  has presented today for surgery, with the diagnosis of [place].  The various methods of treatment have been discussed with the patient and family. After consideration of risks, benefits and other options for treatment, the patient has consented to  Procedure(s): Radio Frequency Ablation with Sonata (N/A) as a surgical intervention.  The patient's history has been reviewed, patient examined, no change in status, stable for surgery.  I have reviewed the patient's chart and labs.  Questions were answered to the patient's satisfaction.     Governor Specking

## 2021-04-13 NOTE — Transfer of Care (Signed)
Immediate Anesthesia Transfer of Care Note  Patient: Linda Ochoa  Procedure(s) Performed: Radio Frequency Ablation with Sonata (Vagina )  Patient Location: PACU  Anesthesia Type:General  Level of Consciousness: awake, alert  and oriented  Airway & Oxygen Therapy: Patient Spontanous Breathing and Patient connected to face mask  Post-op Assessment: Report given to RN and Post -op Vital signs reviewed and stable  Post vital signs: Reviewed and stable  Last Vitals:  Vitals Value Taken Time  BP 155/79 04/13/21 1700  Temp    Pulse 82 04/13/21 1701  Resp 16 04/13/21 1701  SpO2 97 % 04/13/21 1701  Vitals shown include unvalidated device data.  Last Pain:  Vitals:   04/13/21 1126  TempSrc:   PainSc: 0-No pain         Complications: No notable events documented.

## 2021-04-13 NOTE — Anesthesia Preprocedure Evaluation (Signed)
Anesthesia Evaluation  Patient identified by MRN, date of birth, ID band Patient awake    Reviewed: Allergy & Precautions, NPO status , Patient's Chart, lab work & pertinent test results  History of Anesthesia Complications Negative for: history of anesthetic complications  Airway Mallampati: I  TM Distance: >3 FB Neck ROM: Full    Dental  (+) Dental Advisory Given, Teeth Intact   Pulmonary neg pulmonary ROS, Current Smoker and Patient abstained from smoking.,    Pulmonary exam normal        Cardiovascular negative cardio ROS Normal cardiovascular exam     Neuro/Psych negative neurological ROS     GI/Hepatic negative GI ROS, Neg liver ROS,   Endo/Other  Morbid obesity  Renal/GU negative Renal ROS  negative genitourinary   Musculoskeletal negative musculoskeletal ROS (+)   Abdominal   Peds  Hematology  (+) anemia , Hgb 9.5   Anesthesia Other Findings   Reproductive/Obstetrics Fibroids                            Anesthesia Physical Anesthesia Plan  ASA: 3  Anesthesia Plan: General   Post-op Pain Management: Toradol IV (intra-op) and Tylenol PO (pre-op)   Induction: Intravenous  PONV Risk Score and Plan: 3 and Ondansetron, Dexamethasone, Treatment may vary due to age or medical condition and Midazolam  Airway Management Planned: Oral ETT  Additional Equipment: None  Intra-op Plan:   Post-operative Plan: Extubation in OR  Informed Consent: I have reviewed the patients History and Physical, chart, labs and discussed the procedure including the risks, benefits and alternatives for the proposed anesthesia with the patient or authorized representative who has indicated his/her understanding and acceptance.     Dental advisory given  Plan Discussed with:   Anesthesia Plan Comments:         Anesthesia Quick Evaluation

## 2021-04-13 NOTE — Anesthesia Procedure Notes (Signed)
Procedure Name: Intubation Date/Time: 04/13/2021 3:04 PM Performed by: Rosaland Lao, CRNA Pre-anesthesia Checklist: Patient identified, Emergency Drugs available, Suction available and Patient being monitored Patient Re-evaluated:Patient Re-evaluated prior to induction Oxygen Delivery Method: Circle system utilized Preoxygenation: Pre-oxygenation with 100% oxygen Induction Type: IV induction Ventilation: Mask ventilation without difficulty Laryngoscope Size: Miller and 3 Grade View: Grade I Tube type: Oral Tube size: 7.0 mm Number of attempts: 1 Airway Equipment and Method: Stylet Placement Confirmation: ETT inserted through vocal cords under direct vision, positive ETCO2 and breath sounds checked- equal and bilateral Secured at: 22 cm Tube secured with: Tape Dental Injury: Teeth and Oropharynx as per pre-operative assessment

## 2021-05-25 ENCOUNTER — Ambulatory Visit: Payer: No Typology Code available for payment source | Admitting: Obstetrics and Gynecology

## 2021-05-31 ENCOUNTER — Other Ambulatory Visit: Payer: Self-pay

## 2021-05-31 ENCOUNTER — Ambulatory Visit (INDEPENDENT_AMBULATORY_CARE_PROVIDER_SITE_OTHER): Payer: No Typology Code available for payment source | Admitting: Obstetrics and Gynecology

## 2021-05-31 ENCOUNTER — Encounter: Payer: Self-pay | Admitting: Obstetrics and Gynecology

## 2021-05-31 ENCOUNTER — Telehealth: Payer: Self-pay

## 2021-05-31 VITALS — BP 110/72 | HR 82 | Ht 69.0 in | Wt 360.0 lb

## 2021-05-31 DIAGNOSIS — B9689 Other specified bacterial agents as the cause of diseases classified elsewhere: Secondary | ICD-10-CM | POA: Diagnosis not present

## 2021-05-31 DIAGNOSIS — N760 Acute vaginitis: Secondary | ICD-10-CM

## 2021-05-31 DIAGNOSIS — Z8619 Personal history of other infectious and parasitic diseases: Secondary | ICD-10-CM

## 2021-05-31 DIAGNOSIS — N898 Other specified noninflammatory disorders of vagina: Secondary | ICD-10-CM | POA: Diagnosis not present

## 2021-05-31 LAB — WET PREP FOR TRICH, YEAST, CLUE

## 2021-05-31 MED ORDER — METRONIDAZOLE 500 MG PO TABS: 500.0000 mg | ORAL_TABLET | Freq: Two times a day (BID) | ORAL | 0 refills | Status: DC

## 2021-05-31 NOTE — Telephone Encounter (Signed)
Patient called because pharmacy let her know that Metronidazole Rx was ready for pick up. She said she was unaware of her results and was concerned she was pos for trich again. I informed her that wet prep was positive for Bacterial Vaginosis and the Rx was for that. She asked if partner needed to be treated and I advised her it is not a STI so no need for partner to be treated.

## 2021-05-31 NOTE — Patient Instructions (Signed)
Bacterial Vaginosis °Bacterial vaginosis is an infection that occurs when the normal balance of bacteria in the vagina changes. This change is caused by an overgrowth of certain bacteria in the vagina. Bacterial vaginosis is the most common vaginal infection among females aged 33 to 44 years. °This condition increases the risk of sexually transmitted infections (STIs). Treatment can help reduce this risk. Treatment is very important for pregnant women because this condition can cause babies to be born early (prematurely) or at a low birth weight. °What are the causes? °This condition is caused by an increase in harmful bacteria that are normally present in small amounts in the vagina. However, the exact reason this condition develops is not known. °You cannot get bacterial vaginosis from toilet seats, bedding, swimming pools, or contact with objects around you. °What increases the risk? °The following factors may make you more likely to develop this condition: °Having a new sexual partner or multiple sexual partners, or having unprotected sex. °Douching. °Having an intrauterine device (IUD). °Smoking. °Abusing drugs and alcohol. This may lead to riskier sexual behavior. °Taking certain antibiotic medicines. °Being pregnant. °What are the signs or symptoms? °Some women with this condition have no symptoms. Symptoms may include: °Gray or white vaginal discharge. The discharge can be watery or foamy. °A fish-like odor with discharge, especially after sex or during menstruation. °Itching in and around the vagina. °Burning or pain with urination. °How is this diagnosed? °This condition is diagnosed based on: °Your medical history. °A physical exam of the vagina. °Checking a sample of vaginal fluid for harmful bacteria or abnormal cells. °How is this treated? °This condition is treated with antibiotic medicines. These may be given as a pill, a vaginal cream, or a medicine that is put into the vagina (suppository). If the  condition comes back after treatment, a second round of antibiotics may be needed. °Follow these instructions at home: °Medicines °Take or apply over-the-counter and prescription medicines only as told by your health care provider. °Take or apply your antibiotic medicine as told by your health care provider. Do not stop using the antibiotic even if you start to feel better. °General instructions °If you have a female sexual partner, tell her that you have a vaginal infection. She should follow up with her health care provider. If you have a female sexual partner, he does not need treatment. °Avoid sexual activity until you finish treatment. °Drink enough fluid to keep your urine pale yellow. °Keep the area around your vagina and rectum clean. °Wash the area daily with warm water. °Wipe yourself from front to back after using the toilet. °If you are breastfeeding, talk to your health care provider about continuing breastfeeding during treatment. °Keep all follow-up visits. This is important. °How is this prevented? °Self-care °Do not douche. °Wash the outside of your vagina with warm water only. °Wear cotton or cotton-lined underwear. °Avoid wearing tight pants and pantyhose, especially during the summer. °Safe sex °Use protection when having sex. This includes: °Using condoms. °Using dental dams. This is a thin layer of a material made of latex or polyurethane that protects the mouth during oral sex. °Limit the number of sexual partners. To help prevent bacterial vaginosis, it is best to have sex with just one partner (monogamous relationship). °Make sure you and your sexual partner are tested for STIs. °Drugs and alcohol °Do not use any products that contain nicotine or tobacco. These products include cigarettes, chewing tobacco, and vaping devices, such as e-cigarettes. If you need help quitting,   ask your health care provider. °Do not use drugs. °Do not drink alcohol if: °Your health care provider tells you not to  do this. °You are pregnant, may be pregnant, or are planning to become pregnant. °If you drink alcohol: °Limit how much you have to 0-1 drink a day. °Be aware of how much alcohol is in your drink. In the [place] one drink equals one 12 oz bottle of beer (355 mL), one 5 oz glass of wine (148 mL), or one 1½ oz glass of hard liquor (44 mL). °Where to find more information °Centers for Disease Control and Prevention: www.cdc.gov °American Sexual Health Association (ASHA): www.ashastd.org °[place] Department of Health and Human Services, Office on Women's Health: www.womenshealth.gov °Contact a health care provider if: °Your symptoms do not improve, even after treatment. °You have more discharge or pain when urinating. °You have a fever or chills. °You have pain in your abdomen or pelvis. °You have pain during sex. °You have vaginal bleeding between menstrual periods. °Summary °Bacterial vaginosis is a vaginal infection that occurs when the normal balance of bacteria in the vagina changes. It results from an overgrowth of certain bacteria. °This condition increases the risk of sexually transmitted infections (STIs). Getting treated can help reduce this risk. °Treatment is very important for pregnant women because this condition can cause babies to be born early (prematurely) or at low birth weight. °This condition is treated with antibiotic medicines. These may be given as a pill, a vaginal cream, or a medicine that is put into the vagina (suppository). °This information is not intended to replace advice given to you by your health care provider. Make sure you discuss any questions you have with your health care provider. °Document Revised: 10/16/2019 Document Reviewed: 10/16/2019 °Elsevier Patient Education © 2022 [employer] Inc. ° °

## 2021-05-31 NOTE — Progress Notes (Signed)
GYNECOLOGY  VISIT   HPI: 30 y.o.   Married Black or [place] American Not Hispanic or Latino  female   [id number] with Patient's last menstrual period was 05/25/2021.   here for Test of Cure after positive Trich results. Other STD testing was negative. She and her husband have both been treated.   She underwent Sonata ablation of uterine fibroids with Dr Kerin Perna on 04/13/21. Already her cycle seems better.   No itching, burning, irritation. She notices a metallic vaginal odor.   GYNECOLOGIC HISTORY: Patient's last menstrual period was 05/25/2021. Contraception:none  Menopausal hormone therapy: none         OB History     Gravida  2   Para  0   Term  0   Preterm  0   AB  2   Living  0      SAB  0   IAB  0   Ectopic  0   Multiple  0   Live Births  0              Patient Active Problem List   Diagnosis Date Noted   Morbid obesity with BMI of 50.0-59.9, adult ([place]) 09/07/2017    Past Medical History:  Diagnosis Date   Anemia    Blood in stool    Chicken pox    STD (sexually transmitted disease) 08/2016   Trich    Urinary tract infection     Past Surgical History:  Procedure Laterality Date   COLPOSCOPY     gallstones      Current Outpatient Medications  Medication Sig Dispense Refill   Pseudoeph-Doxylamine-DM-APAP (NYQUIL PO) Take 1 Dose by mouth at bedtime as needed (cold sympotms).     No current facility-administered medications for this visit.     ALLERGIES: Baby lotion and Baby oil  Family History  Problem Relation Age of Onset   Arthritis Mother    Hypertension Mother    Diabetes Mother    Arthritis Father    Heart disease Father    Stroke Father    Hypertension Father    Diabetes Father    Hypertension Maternal Grandmother    Stroke Maternal Grandmother    Heart disease Maternal Grandmother    Hypertension Maternal Grandfather    Stroke Maternal Grandfather    Heart disease Maternal Grandfather    Hypertension Paternal  Grandmother    Stroke Paternal Grandmother    Heart disease Paternal Grandmother    Hypertension Paternal Grandfather    Stroke Paternal Grandfather    Heart disease Paternal Grandfather     Social History   Socioeconomic History   Marital status: Married    Spouse name: Not on file   Number of children: Not on file   Years of education: Not on file   Highest education level: Not on file  Occupational History   Not on file  Tobacco Use   Smoking status: Some Days    Types: Cigarettes   Smokeless tobacco: Never  Vaping Use   Vaping Use: Never used  Substance and Sexual Activity   Alcohol use: Yes    Alcohol/week: 1.0 standard drink    Types: 1 Standard drinks or equivalent per week    Comment: SOCIAL   Drug use: No   Sexual activity: Yes    Partners: Male    Birth control/protection: I.U.D.    Comment: MIrena   Other Topics Concern   Not on file  Social History Narrative   Not  on file   Social Determinants of Health   Financial Resource Strain: Not on file  Food Insecurity: Not on file  Transportation Needs: Not on file  Physical Activity: Not on file  Stress: Not on file  Social Connections: Not on file  Intimate Partner Violence: Not on file    Review of Systems  All other systems reviewed and are negative.  PHYSICAL EXAMINATION:    BP 110/72    Pulse 82    Ht 5\' 9"  (1.753 m)    Wt (!) 360 lb (163.3 kg)    LMP 05/25/2021    SpO2 99%    BMI 53.16 kg/m     General appearance: alert, cooperative and appears stated age  Pelvic: External genitalia:  no lesions              Urethra:  normal appearing urethra with no masses, tenderness or lesions              Bartholins and Skenes: normal                 Vagina: normal appearing vagina with a mod amount of blood. No discharge noted              Chaperone was present for exam.  1. History of trichomonal vaginitis She and her partner were treated - Trichomonas vaginalis, RNA  2. Vaginal odor - WET PREP  FOR TRICH, YEAST, CLUE  3. Bacterial vaginitis - metroNIDAZOLE (FLAGYL) 500 MG tablet; Take 1 tablet (500 mg total) by mouth 2 (two) times daily.  Dispense: 14 tablet; Refill: 0  Considering conception, recommended that she start PNV

## 2021-06-02 LAB — TRICHOMONAS VAGINALIS, PROBE AMP: Trichomonas vaginalis RNA: NOT DETECTED

## 2022-01-12 NOTE — Progress Notes (Signed)
31 y.o. [id number] Married Black or [place] American Not Hispanic or Latino female here for annual exam. Pt reports pelvic pressure and dysuria when voiding. Pt is also reporting a "throbbing" like feeling when lying on stomach in lower abdomen.  Period Cycle (Days):  (21-30) Period Duration (Days): 7-10 Menstrual Flow:  (light to heavy then tapers to moderate) Menstrual Control: Tampon, Maxi pad Dysmenorrhea: (!) Mild Dysmenorrhea Symptoms: Cramping  She has a known fibroid uterus, s/p sonata with Dr Kerin Perna in 12/22. Since the surgery her cramps are much better, flow is not as heavy and not as long. Definite improvement.  She has been trying to get pregnant for the last few months.  On PNV.   No FH of any chromosomal or genetic abnormalities.   She has had chicken pox.   Patient's last menstrual period was 01/18/2022 (exact date).          Sexually active: Yes.    The current method of family planning is none.    Exercising: Yes.     3x a week, cardio/stretches Smoker:  yes, some marijuana, no tobacco  Health Maintenance: Pap: 02/05/2020-WNL, HPV- neg; 10/19/2016-WNL History of abnormal Pap: yes, no surgery on her cervix MMG: never BMD: never Colonoscopy: never TDaP: 09/01/2016 Gardasil: Completed   reports that she has never smoked. She has never used smokeless tobacco. She reports current alcohol use of about 1.0 standard drink of alcohol per week.  Drug: Marijuana. Works in [employer], academic.   Past Medical History:  Diagnosis Date   Anemia    Blood in stool    Chicken pox    STD (sexually transmitted disease) 08/2016   Trich    Urinary tract infection     Past Surgical History:  Procedure Laterality Date   COLPOSCOPY     gallstones     MYOMECTOMY  04/13/2021    No current outpatient medications on file.   No current facility-administered medications for this visit.    Family History  Problem Relation Age of Onset   Arthritis Mother    Hypertension Mother     Diabetes Mother    Arthritis Father    Heart disease Father    Stroke Father    Hypertension Father    Diabetes Father    Hypertension Maternal Grandmother    Stroke Maternal Grandmother    Heart disease Maternal Grandmother    Hypertension Maternal Grandfather    Stroke Maternal Grandfather    Heart disease Maternal Grandfather    Hypertension Paternal Grandmother    Stroke Paternal Grandmother    Heart disease Paternal Grandmother    Hypertension Paternal Grandfather    Stroke Paternal Grandfather    Heart disease Paternal Grandfather     Review of Systems  Genitourinary:  Positive for dysuria, menstrual problem and pelvic pain.  All other systems reviewed and are negative.   Exam:   BP 122/84   Pulse 99   Ht '5\' 9"'$  (1.753 m)   Wt (!) 376 lb (170.6 kg)   LMP 01/18/2022 (Exact Date)   SpO2 98%   BMI 55.53 kg/m   Weight change: '@WEIGHTCHANGE'$ @ Height:   Height: '5\' 9"'$  (175.3 cm)  Ht Readings from Last 3 Encounters:  01/20/22 '5\' 9"'$  (1.753 m)  05/31/21 '5\' 9"'$  (1.753 m)  04/06/21 '5\' 9"'$  (1.753 m)    General appearance: alert, cooperative and appears stated age Head: Normocephalic, without obvious abnormality, atraumatic Neck: no adenopathy, supple, symmetrical, trachea midline and thyroid normal to inspection and palpation  Lungs: clear to auscultation bilaterally Cardiovascular: regular rate and rhythm Breasts: normal appearance, no masses or tenderness Abdomen: soft, non-tender; non distended,  no masses,  no organomegaly Extremities: extremities normal, atraumatic, no cyanosis or edema Skin: Skin color, texture, turgor normal. No rashes or lesions Lymph nodes: Cervical, supraclavicular, and axillary nodes normal. No abnormal inguinal nodes palpated Neurologic: Grossly normal   Pelvic: External genitalia:  no lesions              Urethra:  normal appearing urethra with no masses, tenderness or lesions              Bartholins and Skenes: normal                 Vagina:  normal appearing vagina with normal color and discharge, no lesions              Cervix: no lesions               Bimanual Exam:  Uterus:   16-18 week sized, firm, not tender, decreased mobility              Adnexa: no mass, fullness, tenderness               Rectovaginal: Confirms               Anus:  normal sphincter tone, no lesions  Lovena Le, CMA chaperoned for the exam.  1. Well woman exam Discussed breast self exam Discussed calcium and vit D intake  2. Uterine leiomyoma, unspecified location Cycles have improved since the sonata ablation in 12/22. Uterus is slightly smaller  3. Morbid obesity with BMI of 50.0-59.9, adult (HCC) - Hemoglobin A1c - Lipid panel - Comprehensive metabolic panel  4. Family history of diabetes mellitus (DM) - Hemoglobin A1c  5. Screening examination for STD (sexually transmitted disease) - RPR - HIV Antibody (routine testing w rflx) - Hepatitis C antibody - SureSwab Advanced Vaginitis Plus,TMA  6. Encounter for preconception consultation No FH of any genetic or chromosomal abnormalities. Not on any medication. Will check labs. -Continue PNV -Discussed avoid medications, marijuana and ETOH if pregnant - Rubella screen - Sickle Cell Scr  7. Lipids abnormal - Lipid panel  8. Dysuria Mild, will hold on treatment. - Urinalysis, Complete: +RBC, but on her cycle, otherwise not very concerning - Urine Culture

## 2022-01-20 ENCOUNTER — Encounter: Payer: Self-pay | Admitting: Obstetrics and Gynecology

## 2022-01-20 ENCOUNTER — Ambulatory Visit (INDEPENDENT_AMBULATORY_CARE_PROVIDER_SITE_OTHER): Payer: No Typology Code available for payment source | Admitting: Obstetrics and Gynecology

## 2022-01-20 VITALS — BP 122/84 | HR 99 | Ht 69.0 in | Wt 376.0 lb

## 2022-01-20 DIAGNOSIS — R300 Dysuria: Secondary | ICD-10-CM

## 2022-01-20 DIAGNOSIS — Z01419 Encounter for gynecological examination (general) (routine) without abnormal findings: Secondary | ICD-10-CM | POA: Diagnosis not present

## 2022-01-20 DIAGNOSIS — E7889 Other lipoprotein metabolism disorders: Secondary | ICD-10-CM

## 2022-01-20 DIAGNOSIS — Z833 Family history of diabetes mellitus: Secondary | ICD-10-CM

## 2022-01-20 DIAGNOSIS — D259 Leiomyoma of uterus, unspecified: Secondary | ICD-10-CM

## 2022-01-20 DIAGNOSIS — Z6841 Body Mass Index (BMI) 40.0 and over, adult: Secondary | ICD-10-CM

## 2022-01-20 DIAGNOSIS — Z3169 Encounter for other general counseling and advice on procreation: Secondary | ICD-10-CM

## 2022-01-20 DIAGNOSIS — Z113 Encounter for screening for infections with a predominantly sexual mode of transmission: Secondary | ICD-10-CM

## 2022-01-20 LAB — URINALYSIS, COMPLETE
Glucose, UA: NEGATIVE
Hyaline Cast: NONE SEEN /LPF
Leukocytes,Ua: NEGATIVE
Nitrite: NEGATIVE
Specific Gravity, Urine: 1.03 (ref 1.001–1.035)
pH: 5.5 (ref 5.0–8.0)

## 2022-01-20 NOTE — Patient Instructions (Signed)
EXERCISE   We recommended that you start or continue a regular exercise program for good health. Physical activity is anything that gets your body moving, some is better than none. The CDC recommends 150 minutes per week of Moderate-Intensity Aerobic Activity and 2 or more days of Muscle Strengthening Activity.  Benefits of exercise are limitless: helps weight loss/weight maintenance, improves mood and energy, helps with depression and anxiety, improves sleep, tones and strengthens muscles, improves balance, improves bone density, protects from chronic conditions such as heart disease, high blood pressure and diabetes and so much more. To learn more visit: https://www.cdc.gov/physicalactivity/index.html  DIET: Good nutrition starts with a healthy diet of fruits, vegetables, whole grains, and lean protein sources. Drink plenty of water for hydration. Minimize empty calories, sodium, sweets. For more information about dietary recommendations visit: https://health.gov/our-work/nutrition-physical-activity/dietary-guidelines and https://www.myplate.gov/  ALCOHOL:  Women should limit their alcohol intake to no more than 7 drinks/beers/glasses of wine (combined, not each!) per week. Moderation of alcohol intake to this level decreases your risk of breast cancer and liver damage.  If you are concerned that you may have a problem, or your friends have told you they are concerned about your drinking, there are many resources to help. A well-known program that is free, effective, and available to all people all over the nation is Alcoholics Anonymous.  Check out this site to learn more: https://www.aa.org/   CALCIUM AND VITAMIN D:  Adequate intake of calcium and Vitamin D are recommended for bone health.  You should be getting between 1000-1200 mg of calcium and 800 units of Vitamin D daily between diet and supplements  PAP SMEARS:  Pap smears, to check for cervical cancer or precancers,  have traditionally been  done yearly, scientific advances have shown that most women can have pap smears less often.  However, every woman still should have a physical exam from her gynecologist every year. It will include a breast check, inspection of the vulva and vagina to check for abnormal growths or skin changes, a visual exam of the cervix, and then an exam to evaluate the size and shape of the uterus and ovaries. We will also provide age appropriate advice regarding health maintenance, like when you should have certain vaccines, screening for sexually transmitted diseases, bone density testing, colonoscopy, mammograms, etc.   MAMMOGRAMS:  All women over 40 years old should have a routine mammogram.   COLON CANCER SCREENING: Now recommend starting at age 45. At this time colonoscopy is not covered for routine screening until 50. There are take home tests that can be done between 45-49.   COLONOSCOPY:  Colonoscopy to screen for colon cancer is recommended for all women at age 50.  We know, you hate the idea of the prep.  We agree, BUT, having colon cancer and not knowing it is worse!!  Colon cancer so often starts as a polyp that can be seen and removed at colonscopy, which can quite literally save your life!  And if your first colonoscopy is normal and you have no family history of colon cancer, most women don't have to have it again for 10 years.  Once every ten years, you can do something that may end up saving your life, right?  We will be happy to help you get it scheduled when you are ready.  Be sure to check your insurance coverage so you understand how much it will cost.  It may be covered as a preventative service at no cost, but you should check   your particular policy.      Breast Self-Awareness Breast self-awareness means being familiar with how your breasts look and feel. It involves checking your breasts regularly and reporting any changes to your health care provider. Practicing breast self-awareness is  important. A change in your breasts can be a sign of a serious medical problem. Being familiar with how your breasts look and feel allows you to find any problems early, when treatment is more likely to be successful. All women should practice breast self-awareness, including women who have had breast implants. How to do a breast self-exam One way to learn what is normal for your breasts and whether your breasts are changing is to do a breast self-exam. To do a breast self-exam: Look for Changes  Remove all the clothing above your waist. Stand in front of a mirror in a room with good lighting. Put your hands on your hips. Push your hands firmly downward. Compare your breasts in the mirror. Look for differences between them (asymmetry), such as: Differences in shape. Differences in size. Puckers, dips, and bumps in one breast and not the other. Look at each breast for changes in your skin, such as: Redness. Scaly areas. Look for changes in your nipples, such as: Discharge. Bleeding. Dimpling. Redness. A change in position. Feel for Changes Carefully feel your breasts for lumps and changes. It is best to do this while lying on your back on the floor and again while sitting or standing in the shower or tub with soapy water on your skin. Feel each breast in the following way: Place the arm on the side of the breast you are examining above your head. Feel your breast with the other hand. Start in the nipple area and make  inch (2 cm) overlapping circles to feel your breast. Use the pads of your three middle fingers to do this. Apply light pressure, then medium pressure, then firm pressure. The light pressure will allow you to feel the tissue closest to the skin. The medium pressure will allow you to feel the tissue that is a little deeper. The firm pressure will allow you to feel the tissue close to the ribs. Continue the overlapping circles, moving downward over the breast until you feel your  ribs below your breast. Move one finger-width toward the center of the body. Continue to use the  inch (2 cm) overlapping circles to feel your breast as you move slowly up toward your collarbone. Continue the up and down exam using all three pressures until you reach your armpit.  Write Down What You Find  Write down what is normal for each breast and any changes that you find. Keep a written record with breast changes or normal findings for each breast. By writing this information down, you do not need to depend only on memory for size, tenderness, or location. Write down where you are in your menstrual cycle, if you are still menstruating. If you are having trouble noticing differences in your breasts, do not get discouraged. With time you will become more familiar with the variations in your breasts and more comfortable with the exam. How often should I examine my breasts? Examine your breasts every month. If you are breastfeeding, the best time to examine your breasts is after a feeding or after using a breast pump. If you menstruate, the best time to examine your breasts is 5-7 days after your period is over. During your period, your breasts are lumpier, and it may be more   difficult to notice changes. When should I see my health care provider? See your health care provider if you notice: A change in shape or size of your breasts or nipples. A change in the skin of your breast or nipples, such as a reddened or scaly area. Unusual discharge from your nipples. A lump or thick area that was not there before. Pain in your breasts. Anything that concerns you. Preparing for Pregnancy If you are planning to become pregnant, talk to your health care provider about preconception care. This type of care helps you prepare for a safe and healthy pregnancy. During this visit, your health care provider will: Do a complete physical exam, including a Pap test. Take your complete medical history. Give you  information, answer your questions, and help you resolve problems. Preconception checklist Medical history Tell your health care provider about any medical conditions you have or have had. Your pregnancy or your ability to become pregnant may be affected by long-term (chronic) conditions, such as: Diabetes. High blood pressure (hypertension). Thyroid problems. Tell your health care provider about your family's medical history and your partner's medical history. Tell your health care provider if you have or have had any sexually transmitted infections, orSTIs. These can affect your pregnancy. In some cases, they can be passed to your baby. If needed, discuss the benefits of genetic testing. This test checks for conditions that may be passed from parent to child. Tell your health care provider about: Any problems you had getting pregnant or while pregnant. Any medicines you take. These include vitamins, herbal supplements, and over-the-counter medicines. Your history of getting vaccines. Discuss any vaccines that you may need. Diet Ask your health care provider about what foods to eat in order to get a balance of nutrients. This is especially important when you are pregnant or preparing to become pregnant. It is recommended that women of childbearing age take a folic acid supplement of 400 mcg daily and eat foods rich in folic acid to prevent certain birth defects. Ask your health care provider to help you reach a healthy weight before pregnancy. If you are overweight, you may have a higher risk for certain problems. These include hypertension, diabetes, and early (preterm) birth. If you are underweight, you are more likely to have a baby who has a low birth weight. Lifestyle, work, and home Let your health care provider know about: Any lifestyle habits that you have, such as use of alcohol, drugs, or tobacco products. Fun and leisure activities that may put you at risk during pregnancy, such as  downhill skiing and certain exercise programs. Any plans to travel out of the country, especially to places with an active [place] virus outbreak. Harmful substances that you may be exposed to at work or at home. These include chemicals, pesticides, radiation, and substances from cat litter boxes. Any concerns you have for your safety at home. Mental health Tell your health care provider about: Any history of mental health conditions, including feelings of depression, sadness, or anxiety. Any medicines that you take for a mental health condition. These include herbs and supplements. How do I know that I am pregnant? You may be pregnant if you have been sexually active and you miss your period. Other symptoms of early pregnancy include: Mild cramping. Very light vaginal bleeding (spotting). Feeling more tired than usual. Nausea and vomiting. These may be signs of morning sickness. Take a home pregnancy test if you have any of these symptoms. This test checks for a  hormone in your urine called human chorionic gonadotropin, or hCG. A woman's body begins to make this hormone during early pregnancy. These tests are very accurate. Wait until at least the first day after you miss your period to take a home pregnancy test. If the test shows that you are pregnant, call your health care provider for a prenatal care visit. What should I do if I become pregnant? Schedule a visit with your health care provider as soon as you suspect you are pregnant. Talk to your health care provider if you are taking prescription medicines to determine if they are safe to take during pregnancy. You may continue to have sex if it does not cause pain or other problems, such as vaginal bleeding. Follow these instructions at home: Eating and drinking  Follow instructions from your health care provider about eating or drinking restrictions. Drink enough fluid to keep your urine pale yellow. Eat a balanced diet. This includes  fresh fruits and vegetables, whole grains, lean meats, low-fat dairy products, healthy fats, and foods that are high in fiber. Ask to meet with a nutritionist or registered dietitian for help with meal planning and goals. Avoid eating raw or undercooked meat and seafood. Avoid eating or drinking unpasteurized dairy products. Lifestyle     Get regular exercise. Try to be active for at least 30 minutes a day on most days of the week. Ask your health care provider which activities are safe during pregnancy. Maintain a healthy weight. Avoid toxic fumes and chemicals. Avoid cleaning cat litter boxes. Cat feces may contain a harmful parasite called toxoplasma. Avoid travel to countries where [place] virus is common. Do not use any products that contain nicotine or tobacco, such as cigarettes, e-cigarettes, and chewing tobacco. If you need help quitting, ask your health care provider. Do not drink alcohol or use drugs. General instructions Keep an accurate record of your menstrual periods. This makes it easier for your health care provider to determine your baby's due date. Take over-the-counter and prescription medicines only as told by your health care provider. Begin taking prenatal vitamins and folic acid supplements daily as directed. Manage any chronic conditions, such as hypertension and diabetes, as told by your health care provider. This is important. Summary If you are planning to become pregnant, talk to your health care provider about preconception care. This is an important part of planning for a healthy pregnancy. Women of childbearing age should take 959 mcg of folic acid daily in addition to eating a diet rich in folic acid. This will prevent certain birth defects. Schedule a visit with your health care provider as soon as you suspect you are pregnant. Tell your health care provider about your medical history, lifestyle activities, home safety, and other things that may concern you. This  information is not intended to replace advice given to you by your health care provider. Make sure you discuss any questions you have with your health care provider. Document Revised: 01/15/2019 Document Reviewed: 01/15/2019 Elsevier Patient Education  [place].

## 2022-01-21 LAB — SURESWAB® ADVANCED VAGINITIS PLUS,TMA
C. trachomatis RNA, TMA: NOT DETECTED
CANDIDA SPECIES: NOT DETECTED
Candida glabrata: NOT DETECTED
N. gonorrhoeae RNA, TMA: NOT DETECTED
SURESWAB(R) ADV BACTERIAL VAGINOSIS(BV),TMA: POSITIVE — AB
TRICHOMONAS VAGINALIS (TV),TMA: DETECTED — AB

## 2022-01-21 LAB — URINE CULTURE
MICRO NUMBER:: 13956021
SPECIMEN QUALITY:: ADEQUATE

## 2022-01-23 LAB — COMPREHENSIVE METABOLIC PANEL
AG Ratio: 1.3 (calc) (ref 1.0–2.5)
ALT: 17 U/L (ref 6–29)
AST: 16 U/L (ref 10–30)
Albumin: 4.4 g/dL (ref 3.6–5.1)
Alkaline phosphatase (APISO): 82 U/L (ref 31–125)
BUN: 14 mg/dL (ref 7–25)
CO2: 25 mmol/L (ref 20–32)
Calcium: 9.7 mg/dL (ref 8.6–10.2)
Chloride: 104 mmol/L (ref 98–110)
Creat: 0.93 mg/dL (ref 0.50–0.97)
Globulin: 3.3 g/dL (calc) (ref 1.9–3.7)
Glucose, Bld: 109 mg/dL — ABNORMAL HIGH (ref 65–99)
Potassium: 3.8 mmol/L (ref 3.5–5.3)
Sodium: 140 mmol/L (ref 135–146)
Total Bilirubin: 0.4 mg/dL (ref 0.2–1.2)
Total Protein: 7.7 g/dL (ref 6.1–8.1)

## 2022-01-23 LAB — RUBELLA SCREEN: Rubella: 3.02 Index

## 2022-01-23 LAB — LIPID PANEL
Cholesterol: 197 mg/dL (ref <200)
HDL: 61 mg/dL (ref >=50)
LDL Cholesterol (Calc): 105 mg/dL (calc) — ABNORMAL HIGH
Non-HDL Cholesterol (Calc): 136 mg/dL (calc) — ABNORMAL HIGH (ref <130)
Total CHOL/HDL Ratio: 3.2 (calc) (ref <5.0)
Triglycerides: 194 mg/dL — ABNORMAL HIGH (ref <150)

## 2022-01-23 LAB — HEPATITIS C ANTIBODY: Hepatitis C Ab: NONREACTIVE

## 2022-01-23 LAB — SICKLE CELL SCREEN: Sickle Solubility Test - HGBRFX: NEGATIVE

## 2022-01-23 LAB — HIV ANTIBODY (ROUTINE TESTING W REFLEX): HIV 1&2 Ab, 4th Generation: NONREACTIVE

## 2022-01-23 LAB — HEMOGLOBIN A1C
Hgb A1c MFr Bld: 5.2 % of total Hgb (ref <5.7)
Mean Plasma Glucose: 103 mg/dL
eAG (mmol/L): 5.7 mmol/L

## 2022-01-23 LAB — RPR: RPR Ser Ql: NONREACTIVE

## 2022-01-25 ENCOUNTER — Other Ambulatory Visit: Payer: Self-pay | Admitting: *Deleted

## 2022-01-25 MED ORDER — METRONIDAZOLE 500 MG PO TABS: 500.0000 mg | ORAL_TABLET | Freq: Two times a day (BID) | ORAL | 0 refills | Status: DC

## 2022-02-24 ENCOUNTER — Ambulatory Visit: Payer: No Typology Code available for payment source | Admitting: Radiology

## 2022-02-24 ENCOUNTER — Encounter: Payer: Self-pay | Admitting: Radiology

## 2022-02-24 VITALS — BP 148/96

## 2022-02-24 DIAGNOSIS — N898 Other specified noninflammatory disorders of vagina: Secondary | ICD-10-CM | POA: Diagnosis not present

## 2022-02-24 DIAGNOSIS — R030 Elevated blood-pressure reading, without diagnosis of hypertension: Secondary | ICD-10-CM

## 2022-02-24 DIAGNOSIS — N760 Acute vaginitis: Secondary | ICD-10-CM

## 2022-02-24 DIAGNOSIS — A599 Trichomoniasis, unspecified: Secondary | ICD-10-CM | POA: Diagnosis not present

## 2022-02-24 DIAGNOSIS — B9689 Other specified bacterial agents as the cause of diseases classified elsewhere: Secondary | ICD-10-CM

## 2022-02-24 LAB — WET PREP FOR TRICH, YEAST, CLUE

## 2022-02-24 MED ORDER — METRONIDAZOLE 0.75 % VA GEL: 1.0000 | Freq: Every day | VAGINAL | 0 refills | Status: AC

## 2022-02-24 NOTE — Progress Notes (Signed)
      Subjective: Linda Ochoa is a 31 y.o. female here for TOC--Trich 01/20/22 Complains of vaginal discharge, odor, no itching.  Noticed symptoms after finishing Flagyl.   Review of Systems  All other systems reviewed and are negative.   Past Medical History:  Diagnosis Date   Anemia    Blood in stool    Chicken pox    STD (sexually transmitted disease) 08/2016   Trich    Urinary tract infection       Objective:  Today's Vitals   02/24/22 0858 02/24/22 0904  BP: (!) 146/94 (!) 148/96   There is no height or weight on file to calculate BMI.   -General: no acute distress -Vulva: without lesions or discharge -Vagina: discharge present, aptima swab and wet prep obtained -Cervix: no lesion or discharge, no CMT -Perineum: no lesions -Uterus: Mobile, non tender -Adnexa: no masses or tenderness   Microscopic wet-mount exam shows clue cells.   Chaperone offered and declined.  Assessment:/Plan:   1. Trichimoniasis - WET PREP FOR TRICH, YEAST, CLUE  2. Vaginal discharge - WET PREP FOR [place] +BV rx sent for metrogel  3. Elevated blood pressure reading Establish with PCP Continue to take BP at home Warning signs reviewed   Will contact patient with results of testing completed today. Avoid intercourse until symptoms are resolved. Safe sex encouraged. Avoid the use of soaps or perfumed products in the peri area. Avoid tub baths and sitting in sweaty or wet clothing for prolonged periods of time.

## 2022-03-07 ENCOUNTER — Telehealth: Payer: Self-pay | Admitting: *Deleted

## 2022-03-07 MED ORDER — CLINDAMYCIN HCL 300 MG PO CAPS: 300.0000 mg | ORAL_CAPSULE | Freq: Two times a day (BID) | ORAL | 0 refills | Status: DC

## 2022-03-07 NOTE — Telephone Encounter (Signed)
Ok to send clindamycin '300mg'$  po BID x 7 days

## 2022-03-07 NOTE — Telephone Encounter (Signed)
Patient aware Rx sent.  

## 2022-03-07 NOTE — Telephone Encounter (Signed)
Patient saw you on 02/24/22 reports she completed the 5 day dose of Metrogel for trich she still has vaginal odor. She asked if pill form can be sent into the pharmacy? Please advise

## 2022-05-04 ENCOUNTER — Ambulatory Visit: Payer: No Typology Code available for payment source | Admitting: Nurse Practitioner

## 2022-06-27 ENCOUNTER — Ambulatory Visit: Payer: No Typology Code available for payment source | Admitting: Obstetrics and Gynecology

## 2022-06-27 ENCOUNTER — Encounter: Payer: Self-pay | Admitting: Obstetrics and Gynecology

## 2022-06-27 VITALS — BP 138/72 | HR 83 | Wt 388.0 lb

## 2022-06-27 DIAGNOSIS — Z113 Encounter for screening for infections with a predominantly sexual mode of transmission: Secondary | ICD-10-CM

## 2022-06-27 DIAGNOSIS — N762 Acute vulvitis: Secondary | ICD-10-CM | POA: Diagnosis not present

## 2022-06-27 DIAGNOSIS — N926 Irregular menstruation, unspecified: Secondary | ICD-10-CM | POA: Diagnosis not present

## 2022-06-27 LAB — PREGNANCY, URINE: Preg Test, Ur: NEGATIVE

## 2022-06-27 LAB — WET PREP FOR TRICH, YEAST, CLUE

## 2022-06-27 MED ORDER — CLOBETASOL PROPIONATE 0.05 % EX OINT: 1.0000 | TOPICAL_OINTMENT | Freq: Two times a day (BID) | CUTANEOUS | 0 refills | Status: AC

## 2022-06-27 NOTE — Progress Notes (Signed)
GYNECOLOGY  VISIT   HPI: 32 y.o.   Married Black or [place] American Not Hispanic or Latino  female   [id number] with Patient's last menstrual period was 05/17/2022.   here for she states that her period is late and she has started a cycle class and she is having irritation from the bike seats. Not itchy, just sore, no vaginal d/c.  She would also like STD screening.     GYNECOLOGIC HISTORY: Patient's last menstrual period was 05/17/2022. Contraception:none  Menopausal hormone therapy: none         OB History     Gravida  2   Para  0   Term  0   Preterm  0   AB  2   Living  0      SAB  0   IAB  0   Ectopic  0   Multiple  0   Live Births  0              Patient Active Problem List   Diagnosis Date Noted   Morbid obesity with BMI of 50.0-59.9, adult ([place]) 09/07/2017    Past Medical History:  Diagnosis Date   Anemia    Blood in stool    Chicken pox    STD (sexually transmitted disease) 08/2016   Trich    Urinary tract infection     Past Surgical History:  Procedure Laterality Date   COLPOSCOPY     gallstones     MYOMECTOMY  04/13/2021    Current Outpatient Medications  Medication Sig Dispense Refill   amLODipine (NORVASC) 5 MG tablet Take 5 mg by mouth daily.     BIOTIN PO Take by mouth. gummies     No current facility-administered medications for this visit.     ALLERGIES: Baby lotion and Baby oil  Family History  Problem Relation Age of Onset   Arthritis Mother    Hypertension Mother    Diabetes Mother    Arthritis Father    Heart disease Father    Stroke Father    Hypertension Father    Diabetes Father    Hypertension Maternal Grandmother    Stroke Maternal Grandmother    Heart disease Maternal Grandmother    Hypertension Maternal Grandfather    Stroke Maternal Grandfather    Heart disease Maternal Grandfather    Hypertension Paternal Grandmother    Stroke Paternal Grandmother    Heart disease Paternal Grandmother     Hypertension Paternal Grandfather    Stroke Paternal Grandfather    Heart disease Paternal Grandfather     Social History   Socioeconomic History   Marital status: Married    Spouse name: Not on file   Number of children: Not on file   Years of education: Not on file   Highest education level: Not on file  Occupational History   Not on file  Tobacco Use   Smoking status: Never   Smokeless tobacco: Never  Vaping Use   Vaping Use: Never used  Substance and Sexual Activity   Alcohol use: Yes    Alcohol/week: 1.0 standard drink of alcohol    Types: 1 Standard drinks or equivalent per week    Comment: SOCIAL   Drug use: Not on file   Sexual activity: Yes    Partners: Male  Other Topics Concern   Not on file  Social History Narrative   Not on file   Social Determinants of Health   Financial Resource Strain: Not  on file  Food Insecurity: Not on file  Transportation Needs: Not on file  Physical Activity: Not on file  Stress: Not on file  Social Connections: Not on file  Intimate Partner Violence: Not on file    Review of Systems  All other systems reviewed and are negative.   PHYSICAL EXAMINATION:    BP 138/72   Pulse 83   Wt (!) 388 lb (176 kg)   LMP 05/17/2022   SpO2 100%   BMI 57.30 kg/m     General appearance: alert, cooperative and appears stated age  Pelvic: External genitalia:  no lesions, + erythema, small area of excoriation              Urethra:  normal appearing urethra with no masses, tenderness or lesions              Bartholins and Skenes: normal                 Vagina: normal appearing vagina with normal color and discharge, no lesions                1. Acute vulvitis Suspect local skin irritation - WET PREP FOR TRICH, YEAST, CLUE - clobetasol ointment (TEMOVATE) 0.05 %; Apply 1 Application topically 2 (two) times daily. Apply as directed twice daily for up to 2 weeks as needed  Dispense: 60 g; Refill: 0 -Discussed vulvar skin care  2.  Missed menses 10 days late, not preventing pregnancy. On PNV - Pregnancy, urine: negative -Call if no cycle in another week -Discussed BP meds, given a list of preferred BP medication in pregnancy  3. Screening examination for STD (sexually transmitted disease) - SURESWAB CT/NG/T. vaginalis - RPR - HIV Antibody (routine testing w rflx) - Hepatitis C antibody

## 2022-06-27 NOTE — Progress Notes (Addendum)
See Dr Gentry Fitz note

## 2022-06-28 LAB — SURESWAB CT/NG/T. VAGINALIS
C. trachomatis RNA, TMA: NOT DETECTED
N. gonorrhoeae RNA, TMA: NOT DETECTED
Trichomonas vaginalis RNA: NOT DETECTED

## 2022-06-28 LAB — HIV ANTIBODY (ROUTINE TESTING W REFLEX): HIV 1&2 Ab, 4th Generation: NONREACTIVE

## 2022-06-28 LAB — HEPATITIS C ANTIBODY: Hepatitis C Ab: NONREACTIVE

## 2022-06-28 LAB — RPR: RPR Ser Ql: NONREACTIVE

## 2023-07-02 ENCOUNTER — Other Ambulatory Visit: Payer: Self-pay | Admitting: Obstetrics and Gynecology

## 2023-07-02 DIAGNOSIS — D219 Benign neoplasm of connective and other soft tissue, unspecified: Secondary | ICD-10-CM

## 2023-07-13 ENCOUNTER — Ambulatory Visit
Admission: RE | Admit: 2023-07-13 | Discharge: 2023-07-13 | Disposition: A | Discharge status: Home / Self Care | Source: Ambulatory Visit | Attending: Obstetrics and Gynecology | Admitting: Obstetrics and Gynecology

## 2023-07-13 ENCOUNTER — Inpatient Hospital Stay: Admission: RE | Admit: 2023-07-13 | Source: Ambulatory Visit

## 2023-07-13 DIAGNOSIS — D219 Benign neoplasm of connective and other soft tissue, unspecified: Secondary | ICD-10-CM

## 2024-01-08 ENCOUNTER — Encounter: Payer: Self-pay | Admitting: Obstetrics and Gynecology

## 2024-01-08 ENCOUNTER — Ambulatory Visit: Admitting: Obstetrics and Gynecology

## 2024-01-08 ENCOUNTER — Other Ambulatory Visit (HOSPITAL_COMMUNITY)
Admission: RE | Admit: 2024-01-08 | Discharge: 2024-01-08 | Disposition: A | Discharge status: Home / Self Care | Source: Ambulatory Visit | Attending: Obstetrics and Gynecology | Admitting: Obstetrics and Gynecology

## 2024-01-08 VITALS — BP 145/85 | HR 77 | Wt 386.1 lb

## 2024-01-08 DIAGNOSIS — Z01818 Encounter for other preprocedural examination: Secondary | ICD-10-CM

## 2024-01-08 DIAGNOSIS — Z124 Encounter for screening for malignant neoplasm of cervix: Secondary | ICD-10-CM | POA: Insufficient documentation

## 2024-01-08 DIAGNOSIS — Z1331 Encounter for screening for depression: Secondary | ICD-10-CM | POA: Diagnosis not present

## 2024-01-08 DIAGNOSIS — Z3169 Encounter for other general counseling and advice on procreation: Secondary | ICD-10-CM | POA: Diagnosis not present

## 2024-01-08 DIAGNOSIS — D219 Benign neoplasm of connective and other soft tissue, unspecified: Secondary | ICD-10-CM | POA: Diagnosis not present

## 2024-01-08 NOTE — Progress Notes (Signed)
 NEW GYNECOLOGY PATIENT Patient name: Linda Ochoa MRN [id number]  Date of birth: [birth year] Chief Complaint:   Gynecologic Exam  History:  Discussed the use of AI scribe software for clinical note transcription with the patient, who gave verbal consent to proceed.  History of Present Illness Linda Ochoa is a 33 year old female with uterine fibroids who presents with concerns about fibroid growth and fertility.  She has a history of uterine fibroids and underwent a Sonata  procedure in 2022. Since the procedure, the fibroids have increased in size. An ultrasound earlier this year indicated an enlarged uterus with multiple fibroids, but it was unclear whether there is one large fibroid or several smaller ones.  She is interested in surgical options to manage the fibroids but wishes to preserve her uterus for future pregnancies. She has previously been pregnant but did not carry to term. She believes she is capable of having children despite the fibroids and is concerned about the potential impact of fibroids on pregnancy and the implications of surgical intervention on future pregnancies.  She has been discussing fertility concerns with her partner, who is uncertain about his ability to have children.  She has a family history of women conceiving into their late thirties.  She is currently on an appetite suppressant as part of a weight management plan.      Gynecologic History Patient's last menstrual period was 12/31/2023 (exact date). Contraception: none   OB History  Gravida Para Term Preterm AB Living  2 0 0 0 2 0  SAB IAB Ectopic Multiple Live Births  0 0 0 0 0    # Outcome Date GA Lbr Len/2nd Weight Sex Type Anes PTL Lv  2 AB           1 AB              The following portions of the patient's history were reviewed and updated as appropriate: allergies, current medications, past family history, past medical history, past social history, past surgical history and  problem list. Health Maintenance  Topic Date Due   Hepatitis B Vaccine (1 of 3 - 19+ 3-dose series) Never done   HPV Vaccine (1 - 3-dose SCDM series) Never done   Flu Shot  11/30/2023   COVID-19 Vaccine (3 - 2025-26 season) 12/31/2023   Pap with HPV screening  02/04/2025   DTaP/Tdap/Td vaccine (3 - Td or Tdap) 09/02/2026   Hepatitis C Screening  Completed   HIV Screening  Completed   Pneumococcal Vaccine  Aged Out   Meningitis B Vaccine  Aged Out     Review of Systems Pertinent items noted in HPI and remainder of comprehensive ROS otherwise negative.  Physical Exam:  BP (!) 145/85   Pulse 77   Wt (!) 386 lb 1.6 oz (175.1 kg)   LMP 12/31/2023 (Exact Date)   BMI 57.02 kg/m  Physical Exam Vitals and nursing note reviewed. Exam conducted with a chaperone present.  Constitutional:      Appearance: Normal appearance.  Cardiovascular:     Rate and Rhythm: Normal rate.  Pulmonary:     Effort: Pulmonary effort is normal.     Breath sounds: Normal breath sounds.  Abdominal:     Comments: Fibroid uterus up to umbilicus, ~20 weeks  Genitourinary:    General: Normal vulva.     Comments: Nulliparous cervix, anteriorly deviated Neurological:     General: No focal deficit present.     Mental Status: She is  alert and oriented to person, place, and time.  Psychiatric:        Mood and Affect: Mood normal.        Behavior: Behavior normal.        Thought Content: Thought content normal.        Judgment: Judgment normal.        Assessment and Plan:  Assessment and Plan Assessment & Plan Uterine fibroids Increase in fibroid size post-Sonata  procedure. Ultrasound inconclusive on fibroid characteristics. Laparoscopic surgery feasible for single large fibroid. Retention of uterus desired for future pregnancies. Reviewed risks of myomectomy and implications on future including mode of delivery (I.e. cesarean deliveries) and possible hysterectomy for definitive treatment. - Order  pelvic MRI to assess fibroid size and location. - Consider myomectomy if MRI shows single large fibroid amenable to laparoscopic removal.  Preconception counseling Desires children, concerned about partner's fertility. History suggests potential fertility. Advanced maternal age noted but reviewed risk changes that occur with increasing age at 89 vs 38 vs 43. Discussed partner's sperm evaluation and fallopian tube patency. - Refer partner for semen analysis at [facility] or [place] office. - Schedule hysterosalpingogram (HSG) based on menstrual cycle to assess fallopian tube patency. - Discuss potential need for assisted reproductive technologies if semen analysis indicates issues.   Follow-up: No follow-ups on file.      Carter Quarry, MD Obstetrician & Gynecologist, Faculty [facility] [employer], [facility]

## 2024-01-11 LAB — CYTOLOGY - PAP
Adequacy: ABSENT
Chlamydia: NEGATIVE
Comment: NEGATIVE
Comment: NEGATIVE
Comment: NEGATIVE
Comment: NORMAL
Diagnosis: NEGATIVE
High risk HPV: NEGATIVE
Neisseria Gonorrhea: NEGATIVE
Trichomonas: NEGATIVE

## 2024-01-14 ENCOUNTER — Ambulatory Visit: Payer: Self-pay | Admitting: Obstetrics and Gynecology

## 2024-01-31 ENCOUNTER — Encounter: Payer: Self-pay | Admitting: *Deleted

## 2024-01-31 DIAGNOSIS — Z3169 Encounter for other general counseling and advice on procreation: Secondary | ICD-10-CM

## 2024-02-13 ENCOUNTER — Other Ambulatory Visit: Payer: Self-pay

## 2024-06-03 ENCOUNTER — Telehealth: Payer: Self-pay

## 2024-06-03 NOTE — Telephone Encounter (Signed)
 Calling to review results and to follow up on MRI referral.

## 2024-06-03 NOTE — Telephone Encounter (Signed)
 Pt notified 06/13/24 at 1100 for MRI at [facility]  Pt agreed to appt.  Rhonda notified as well to receive prior auth for appointment.   Darnell Stimson,RN  06/03/24

## 2024-06-13 ENCOUNTER — Ambulatory Visit (HOSPITAL_COMMUNITY)
